# Patient Record
Sex: Male | Born: 1995 | Race: Black or African American | Hispanic: No | Marital: Single | Smoking: Never smoker
Health system: Southern US, Community
[De-identification: ages and names within clinical notes are randomized; demographics above are authoritative.]

## PROBLEM LIST (undated history)

## (undated) DIAGNOSIS — K649 Unspecified hemorrhoids: Secondary | ICD-10-CM

## (undated) DIAGNOSIS — E669 Obesity, unspecified: Secondary | ICD-10-CM

## (undated) HISTORY — DX: Obesity, unspecified: E66.9

---
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
## 2013-05-14 ENCOUNTER — Emergency Department (HOSPITAL_COMMUNITY): Payer: Self-pay

## 2013-05-14 ENCOUNTER — Encounter (HOSPITAL_COMMUNITY): Payer: Self-pay | Admitting: *Deleted

## 2013-05-14 ENCOUNTER — Emergency Department (HOSPITAL_COMMUNITY)
Admission: EM | Admit: 2013-05-14 | Discharge: 2013-05-14 | Disposition: A | Discharge status: Home / Self Care | Payer: Self-pay | Attending: Emergency Medicine | Admitting: Emergency Medicine

## 2013-05-14 DIAGNOSIS — S63509A Unspecified sprain of unspecified wrist, initial encounter: Secondary | ICD-10-CM | POA: Insufficient documentation

## 2013-05-14 DIAGNOSIS — Y9289 Other specified places as the place of occurrence of the external cause: Secondary | ICD-10-CM | POA: Insufficient documentation

## 2013-05-14 DIAGNOSIS — S63502A Unspecified sprain of left wrist, initial encounter: Secondary | ICD-10-CM

## 2013-05-14 DIAGNOSIS — R296 Repeated falls: Secondary | ICD-10-CM | POA: Insufficient documentation

## 2013-05-14 DIAGNOSIS — Y9389 Activity, other specified: Secondary | ICD-10-CM | POA: Insufficient documentation

## 2013-05-14 MED ORDER — IBUPROFEN 600 MG PO TABS: 600.0000 mg | ORAL_TABLET | Freq: Four times a day (QID) | ORAL | Status: DC | PRN

## 2013-05-14 NOTE — ED Notes (Signed)
Pt fell on left wrist while performing in play last night, cms intact distal

## 2013-05-14 NOTE — ED Provider Notes (Signed)
History     CSN: [id number]  Arrival date & time 05/14/13  1407   First MD Initiated Contact with Patient 05/14/13 1427      Chief Complaint  Patient presents with  . Wrist Pain    (Consider location/radiation/quality/duration/timing/severity/associated sxs/prior treatment) HPI Wayne Reed is a 17 y.o. male who presents to the ED with left wrist pain. He fell while performing in a play last night. He was supposed to fall but not as hard as he did. He complains of pain in the left wrist on the lateral aspect. Denies any other injuries.  History reviewed. No pertinent past medical history.  History reviewed. No pertinent past surgical history.  No family history on file.  History  Substance Use Topics  . Smoking status: Not on file  . Smokeless tobacco: Not on file  . Alcohol Use: Not on file      Review of Systems  Constitutional: Negative for fever.  HENT: Negative for neck pain.   Gastrointestinal: Negative for nausea and vomiting.  Musculoskeletal:       Left wrist pain  Skin: Negative for wound.  Psychiatric/Behavioral: The patient is not nervous/anxious.     Allergies  Review of patient's allergies indicates no known allergies.  Home Medications  No current outpatient prescriptions on file.  BP 130/73  Pulse 80  Temp(Src) 99.2 F (37.3 C) (Oral)  Resp 20  SpO2 100%  Physical Exam  Nursing note and vitals reviewed. Constitutional: [name] oriented to person, place, and time. He appears well-developed and well-nourished. No distress.  HENT:  Head: Normocephalic.  Eyes: EOM are normal.  Neck: Normal range of motion. Neck supple.  Cardiovascular: Normal rate.   Pulmonary/Chest: Effort normal.  Musculoskeletal:       Left wrist: He exhibits tenderness. He exhibits normal range of motion, no swelling, no deformity and no laceration.  Tender with range of motion and palpation ulnar aspect of left wrist. Radial pulse present, adequate circulation,  good touch sensation.  Neurological: [name] alert and oriented to person, place, and time. He has normal strength. No cranial nerve deficit or sensory deficit.  Skin: Skin is warm and dry.  Psychiatric: He has a normal mood and affect. His behavior is normal. Judgment and thought content normal.    ED Course  Procedures (including critical care time)  Dg Wrist Complete Left  05/14/2013   *RADIOLOGY REPORT*  Clinical Data: Fall  LEFT WRIST - COMPLETE 3+ VIEW  Comparison: None.  Findings: No acute fracture and no dislocation.  Unremarkable soft tissues.  Prominent negative ulnar variance deformity.  IMPRESSION: No acute bony pathology.  Chronic change.   Original Report Authenticated By: Jolaine Click, M.D.    MDM  17 y.o. male with sprain to left wrist. Will apply wrist splint, apply ice, elevate and follow up with his PCP. Patient stable for discharge without signs of compartment syndrome.    Medication List    TAKE these medications       ibuprofen 600 MG tablet  Commonly known as:  ADVIL,MOTRIN  Take 1 tablet (600 mg total) by mouth every 6 (six) hours as needed for pain.     ibuprofen 600 MG tablet  Commonly known as:  ADVIL,MOTRIN  Take 1 tablet (600 mg total) by mouth every 6 (six) hours as needed for pain.               [facility] Orlene Och, [place] 05/14/13 1745

## 2013-05-15 NOTE — ED Provider Notes (Signed)
Medical screening examination/treatment/procedure(s) were performed by non-physician practitioner and as supervising physician I was immediately available for consultation/collaboration.   Laray Anger, DO 05/15/13 2133

## 2013-09-23 ENCOUNTER — Encounter (HOSPITAL_COMMUNITY): Payer: Self-pay | Admitting: *Deleted

## 2013-09-23 ENCOUNTER — Emergency Department (HOSPITAL_COMMUNITY): Payer: Self-pay

## 2013-09-23 ENCOUNTER — Emergency Department (HOSPITAL_COMMUNITY)
Admission: EM | Admit: 2013-09-23 | Discharge: 2013-09-23 | Disposition: A | Discharge status: Home / Self Care | Payer: Self-pay | Attending: Emergency Medicine | Admitting: Emergency Medicine

## 2013-09-23 DIAGNOSIS — R1110 Vomiting, unspecified: Secondary | ICD-10-CM | POA: Insufficient documentation

## 2013-09-23 DIAGNOSIS — I880 Nonspecific mesenteric lymphadenitis: Secondary | ICD-10-CM | POA: Insufficient documentation

## 2013-09-23 LAB — URINALYSIS, ROUTINE W REFLEX MICROSCOPIC
Hgb urine dipstick: NEGATIVE
Ketones, ur: NEGATIVE mg/dL
Specific Gravity, Urine: >1.03 — ABNORMAL HIGH (ref 1.005–1.030)

## 2013-09-23 LAB — BASIC METABOLIC PANEL
BUN: 9 mg/dL (ref 6–23)
CO2: 26 mEq/L (ref 19–32)
Calcium: 10.3 mg/dL (ref 8.4–10.5)
Chloride: 103 mEq/L (ref 96–112)
Creatinine, Ser: 0.73 mg/dL (ref 0.50–1.35)
GFR calc Af Amer: >90 mL/min (ref >90)
GFR calc non Af Amer: >90 mL/min (ref >90)
Glucose, Bld: 90 mg/dL (ref 70–99)
Potassium: 4.5 mEq/L (ref 3.5–5.1)
Sodium: 140 mEq/L (ref 135–145)

## 2013-09-23 LAB — CBC WITH DIFFERENTIAL/PLATELET
Basophils Absolute: 0 10*3/uL (ref 0.0–0.1)
Basophils Relative: 0 % (ref 0–1)
Eosinophils Absolute: 0.4 10*3/uL (ref 0.0–0.7)
Eosinophils Relative: 4 % (ref 0–5)
HCT: 42.3 % (ref 39.0–52.0)
Hemoglobin: 14.3 g/dL (ref 13.0–17.0)
Lymphocytes Relative: 29 % (ref 12–46)
Lymphs Abs: 2.4 10*3/uL (ref 0.7–4.0)
MCH: 27.7 pg (ref 26.0–34.0)
MCHC: 33.8 g/dL (ref 30.0–36.0)
MCV: 82 fL (ref 78.0–100.0)
Monocytes Absolute: 0.8 10*3/uL (ref 0.1–1.0)
Monocytes Relative: 9 % (ref 3–12)
Neutro Abs: 4.8 10*3/uL (ref 1.7–7.7)
Neutrophils Relative %: 57 % (ref 43–77)
Platelets: 333 10*3/uL (ref 150–400)
RBC: 5.16 MIL/uL (ref 4.22–5.81)
RDW: 14.5 % (ref 11.5–15.5)
WBC: 8.4 10*3/uL (ref 4.0–10.5)

## 2013-09-23 MED ORDER — SODIUM CHLORIDE 0.9 % IV BOLUS (SEPSIS)
1000.0000 mL | Freq: Once | INTRAVENOUS | Status: AC
  Administered 2013-09-23: 1000 mL via INTRAVENOUS

## 2013-09-23 MED ORDER — IOHEXOL 300 MG/ML  SOLN: 50.0000 mL | Freq: Once | INTRAMUSCULAR | Status: DC | PRN

## 2013-09-23 MED ORDER — KETOROLAC TROMETHAMINE 30 MG/ML IJ SOLN: 30.0000 mg | Freq: Once | INTRAMUSCULAR | Status: DC

## 2013-09-23 MED ORDER — IOHEXOL 300 MG/ML  SOLN
50.0000 mL | Freq: Once | INTRAMUSCULAR | Status: AC | PRN
  Administered 2013-09-23: 50 mL via ORAL

## 2013-09-23 MED ORDER — OXYCODONE-ACETAMINOPHEN 5-325 MG PO TABS
1.0000 | ORAL_TABLET | Freq: Once | ORAL | Status: AC
  Administered 2013-09-23: 1 via ORAL
  Filled 2013-09-23: qty 1

## 2013-09-23 MED ORDER — TRAMADOL HCL 50 MG PO TABS: 50.0000 mg | ORAL_TABLET | Freq: Four times a day (QID) | ORAL | Status: DC | PRN

## 2013-09-23 MED ORDER — IOHEXOL 300 MG/ML  SOLN
100.0000 mL | Freq: Once | INTRAMUSCULAR | Status: AC | PRN
  Administered 2013-09-23: 100 mL via INTRAVENOUS

## 2013-09-23 MED ORDER — ONDANSETRON 8 MG PO TBDP
8.0000 mg | ORAL_TABLET | Freq: Once | ORAL | Status: AC
  Administered 2013-09-23: 8 mg via ORAL
  Filled 2013-09-23: qty 1

## 2013-09-23 NOTE — ED Provider Notes (Signed)
CSN: [id number]     Arrival date & time 09/23/13  1227 History   First MD Initiated Contact with Patient 09/23/13 1303     Chief Complaint  Patient presents with  . Abdominal Pain   (Consider location/radiation/quality/duration/timing/severity/associated sxs/prior Treatment) HPI  17 year old male with right-sided flank, R midback and RLQ pain. Onset about 2 days ago. Worsening throughout the day today. Vomited  this morning. Pain is achy in character and sharper pain with movement. No urinary complaints. No fever or chills. No history similar complaints. Denies trauma. No intervention prior to arrival.  History reviewed. No pertinent past medical history. History reviewed. No pertinent past surgical history. History reviewed. No pertinent family history. History  Substance Use Topics  . Smoking status: Never Smoker   . Smokeless tobacco: Not on file  . Alcohol Use: No    Review of Systems  All systems reviewed and negative, other than as noted in HPI.   Allergies  Review of patient's allergies indicates no known allergies.  Home Medications  No current outpatient prescriptions on file. BP 137/76  Pulse 79  Temp(Src) 98.4 F (36.9 C) (Oral)  Resp 20  Ht 6\' 4"  (1.93 m)  Wt 294 lb (133.358 kg)  BMI 35.8 kg/m2  SpO2 100% Physical Exam  Nursing note and vitals reviewed. Constitutional: He appears well-developed and well-nourished. No distress.  HENT:  Head: Normocephalic and atraumatic.  Eyes: Conjunctivae are normal. Right eye exhibits no discharge. Left eye exhibits no discharge.  Neck: Neck supple.  Cardiovascular: Normal rate, regular rhythm and normal heart sounds.  Exam reveals no gallop and no friction rub.   No murmur heard. Pulmonary/Chest: Effort normal and breath sounds normal. No respiratory distress.  Abdominal: Soft. He exhibits no distension and no mass. There is no tenderness. There is no guarding.  Genitourinary:  R CVA tenderness  Musculoskeletal: He  exhibits no edema and no tenderness.  Neurological: [name] alert.  Skin: Skin is warm and dry.  Psychiatric: He has a normal mood and affect. His behavior is normal. Thought content normal.    ED Course  Procedures (including critical care time) Labs Review Labs Reviewed  URINALYSIS, ROUTINE W REFLEX MICROSCOPIC - Abnormal; Notable for the following:    Specific Gravity, Urine >1.030 (*)    All other components within normal limits  CBC WITH DIFFERENTIAL  BASIC METABOLIC PANEL   Imaging Review Ct Abdomen Pelvis W Contrast  09/23/2013   CLINICAL DATA:  Right-sided abdominal pain and vomiting this morning.  EXAM: CT ABDOMEN AND PELVIS WITH CONTRAST  TECHNIQUE: Multidetector CT imaging of the abdomen and pelvis was performed using the standard protocol following bolus administration of intravenous contrast.  CONTRAST:  50mL OMNIPAQUE IOHEXOL 300 MG/ML SOLN, OMNIPAQUE IOHEXOL 300 MG/ML SOLN  COMPARISON:  None  FINDINGS: The lung bases are unremarkable.  No focal abnormality identified within the liver, spleen, pancreas, adrenal glands, or kidneys. The ureteral courses are unremarkable. The gallbladder is present.  The stomach and small bowel loops are normal in appearance. The appendix is well seen and has a normal appearance. Colonic loops are normal in appearance. Within the right lower quadrant mesentery, there are multiple enlarged lymph nodes, measuring up to 1.6 cm. No abscess, free intraperitoneal air.  No free pelvic fluid. No evidence for aortic aneurysm. Visualized osseous structures have a normal appearance.  IMPRESSION: 1. Normal appearing appendix. 2. Right lower quadrant mesenteric lymph nodes, consistent with mesenteric adenitis. 3. No abscess or bowel obstruction.  Electronically Signed   By: Rosalie Gums M.D.   On: 09/23/2013 15:25    MDM   1. Mesenteric adenitis     17 year old male with right lower quadrant pain. CT consistent with mesenteric adenitis. Plan symptomatic  treatment for likely self-limited process. Return precautions discussed.      Raeford Razor, MD 10/03/13 [phone]

## 2013-09-23 NOTE — ED Notes (Signed)
Pain rt lateral chest 2 days ago and now pain rt flank and RLQ.  Vomited x1 this am.  Increased pain with movement. No injury.  No fever.

## 2013-12-14 ENCOUNTER — Encounter (HOSPITAL_COMMUNITY): Payer: Self-pay | Admitting: Emergency Medicine

## 2013-12-14 ENCOUNTER — Emergency Department (HOSPITAL_COMMUNITY)
Admission: EM | Admit: 2013-12-14 | Discharge: 2013-12-15 | Disposition: A | Discharge status: Home / Self Care | Payer: Self-pay | Attending: Emergency Medicine | Admitting: Emergency Medicine

## 2013-12-14 DIAGNOSIS — R51 Headache: Secondary | ICD-10-CM | POA: Insufficient documentation

## 2013-12-14 DIAGNOSIS — H53149 Visual discomfort, unspecified: Secondary | ICD-10-CM | POA: Insufficient documentation

## 2013-12-14 DIAGNOSIS — J069 Acute upper respiratory infection, unspecified: Secondary | ICD-10-CM | POA: Insufficient documentation

## 2013-12-14 MED ORDER — KETOROLAC TROMETHAMINE 60 MG/2ML IM SOLN
60.0000 mg | Freq: Once | INTRAMUSCULAR | Status: AC
  Administered 2013-12-14: 60 mg via INTRAMUSCULAR
  Filled 2013-12-14: qty 2

## 2013-12-14 MED ORDER — NAPROXEN 500 MG PO TABS: 500.0000 mg | ORAL_TABLET | Freq: Two times a day (BID) | ORAL | Status: DC

## 2013-12-14 NOTE — ED Notes (Signed)
Patient complaining of headache x 4 days.

## 2013-12-14 NOTE — ED Notes (Signed)
Pt c/o headache. Denies visual disturbances.  Denies nausea at this time.

## 2013-12-14 NOTE — ED Provider Notes (Signed)
CSN: [id number]     Arrival date & time 12/14/13  2306 History   First MD Initiated Contact with Patient 12/14/13 2333     Chief Complaint  Patient presents with  . Headache   (Consider location/radiation/quality/duration/timing/severity/associated sxs/prior Treatment) Patient is a 17 y.o. male presenting with headaches.  Headache   17 year old male, no significant past medical history presents with approximately 4 days of intermittent headaches. This started shortly after ingesting food from a local fast food restaurant, followed by 2 days of nausea vomiting and diarrhea which has since resolved. [name] now able to tolerate oral fluids and foods and has been taking ibuprofen. His headache is intermittent, it will completely resolve and then will come back on in a gradual fashion, sometimes located in the bitemporal areas, sometimes in the retro-orbital areas and sometimes posterior and involving the neck. He has associated photophobia intermittently but no more nausea, no weakness numbness difficulty walking dizziness lightheadedness or vertigo. He does not have a history of headaches, he has not had any head injuries, he does not have any fevers nor does he have any exposure to carbon monoxide.  He states that over the last 24 hours he has developed some nasal discharge and congestion but no sore throat or cough  History reviewed. No pertinent past medical history. History reviewed. No pertinent past surgical history. History reviewed. No pertinent family history. History  Substance Use Topics  . Smoking status: Never Smoker   . Smokeless tobacco: Not on file  . Alcohol Use: No    Review of Systems  Neurological: Positive for headaches.  All other systems reviewed and are negative.    Allergies  Review of patient's allergies indicates no known allergies.  Home Medications   Current Outpatient Rx  Name  Route  Sig  Dispense  Refill  . naproxen (NAPROSYN) 500 MG tablet   Oral  Take 1 tablet (500 mg total) by mouth 2 (two) times daily with a meal.   30 tablet   0   . traMADol (ULTRAM) 50 MG tablet   Oral   Take 1 tablet (50 mg total) by mouth every 6 (six) hours as needed for pain.   15 tablet   0    BP 136/92  Pulse 83  Temp(Src) 98.3 F (36.8 C) (Oral)  Resp 18  Ht 6\' 3"  (1.905 m)  Wt 292 lb (132.45 kg)  BMI 36.50 kg/m2  SpO2 100% Physical Exam  Nursing note and vitals reviewed. Constitutional: He appears well-developed and well-nourished. No distress.  HENT:  Head: Normocephalic and atraumatic.  Mouth/Throat: Oropharynx is clear and moist. No oropharyngeal exudate.  Tympanic membranes are clear bilaterally, nostrils clear with no swollen turbinates, no discharge, clear oropharynx  Eyes: Conjunctivae and EOM are normal. Pupils are equal, round, and reactive to light. Right eye exhibits no discharge. Left eye exhibits no discharge. No scleral icterus.  Neck: Normal range of motion. Neck supple. No JVD present. No thyromegaly present.  Very supple neck with no lymphadenopathy  Cardiovascular: Normal rate, regular rhythm, normal heart sounds and intact distal pulses.  Exam reveals no gallop and no friction rub.   No murmur heard. Pulmonary/Chest: Effort normal and breath sounds normal. No respiratory distress. He has no wheezes. He has no rales.  Abdominal: Soft. Bowel sounds are normal. He exhibits no distension and no mass. There is no tenderness.  Musculoskeletal: Normal range of motion. He exhibits no edema and no tenderness.  Lymphadenopathy:    He has  no cervical adenopathy.  Neurological: [name] alert. Coordination normal.  Clear speech, normal gait, normal strength, normal memory, normal coordination  Skin: Skin is warm and dry. No rash noted. No erythema.  Psychiatric: He has a normal mood and affect. His behavior is normal.    ED Course  Procedures (including critical care time) Labs Review Labs Reviewed - No data to display Imaging  Review No results found.  EKG Interpretation   None       MDM   1. Headache   2. URI, acute    The patient is well-appearing, normal vital signs, likely has an upper respiratory infection which is very mild, after developing a foodborne illness he has developed a mild intermittent headache. He does not have a headache at this time, he appears benign, no red flags for pathologic sources of his headache, no indication for imaging, stable for discharge, discussed with the patient indications for return and he has voiced his understanding.   Meds given in ED:  Medications  ketorolac (TORADOL) injection 60 mg (not administered)    New Prescriptions   NAPROXEN (NAPROSYN) 500 MG TABLET    Take 1 tablet (500 mg total) by mouth 2 (two) times daily with a meal.        Vida Roller, MD 12/14/13 2344

## 2014-03-17 ENCOUNTER — Encounter (HOSPITAL_COMMUNITY): Payer: Self-pay | Admitting: Emergency Medicine

## 2014-03-17 ENCOUNTER — Emergency Department (HOSPITAL_COMMUNITY)
Admission: EM | Admit: 2014-03-17 | Discharge: 2014-03-17 | Disposition: A | Discharge status: Home / Self Care | Payer: Self-pay | Attending: Emergency Medicine | Admitting: Emergency Medicine

## 2014-03-17 DIAGNOSIS — K5900 Constipation, unspecified: Secondary | ICD-10-CM | POA: Insufficient documentation

## 2014-03-17 DIAGNOSIS — Z79899 Other long term (current) drug therapy: Secondary | ICD-10-CM | POA: Insufficient documentation

## 2014-03-17 DIAGNOSIS — K644 Residual hemorrhoidal skin tags: Secondary | ICD-10-CM | POA: Insufficient documentation

## 2014-03-17 MED ORDER — HYDROCORTISONE 2.5 % RE CREA: TOPICAL_CREAM | RECTAL | Status: DC

## 2014-03-17 MED ORDER — DOCUSATE SODIUM 250 MG PO CAPS: 250.0000 mg | ORAL_CAPSULE | Freq: Every day | ORAL | Status: DC

## 2014-03-17 NOTE — ED Notes (Signed)
Pt received discharge instructions and prescriptions, verbalized understanding and has no further questions. Pt ambulated to exit in stable condition.  Advised to return to emergency department with new or worsening symptoms.  

## 2014-03-17 NOTE — ED Notes (Signed)
Pt c/o rectal pain and bleeding x1-2 days. Pt states "My dad told me he thinks it's hemorrhoids".

## 2014-03-17 NOTE — Discharge Instructions (Signed)
Fiber Content in Foods Drinking plenty of fluids and consuming foods high in fiber can help with constipation. See the list below for the fiber content of some common foods. Starches and Grains / Dietary Fiber (g)  Cheerios, 1 cup / 3 g  Kellogg's Corn Flakes, 1 cup / 0.7 g  Rice Krispies, 1  cup / 0.3 g  Quaker Oat Life Cereal,  cup / 2.1 g  Oatmeal, instant (cooked),  cup / 2 g  Kellogg's Frosted Mini Wheats, 1 cup / 5.1 g  Rice, brown, long-grain (cooked), 1 cup / 3.5 g  Rice, white, long-grain (cooked), 1 cup / 0.6 g  Macaroni, cooked, enriched, 1 cup / 2.5 g Legumes / Dietary Fiber (g)  Beans, baked, canned, plain or vegetarian,  cup / 5.2 g  Beans, kidney, canned,  cup / 6.8 g  Beans, pinto, dried (cooked),  cup / 7.7 g  Beans, pinto, canned,  cup / 5.5 g Breads and Crackers / Dietary Fiber (g)  Graham crackers, plain or honey, 2 squares / 0.7 g  Saltine crackers, 3 squares / 0.3 g  Pretzels, plain, salted, 10 pieces / 1.8 g  Bread, whole-wheat, 1 slice / 1.9 g  Bread, white, 1 slice / 0.7 g  Bread, raisin, 1 slice / 1.2 g  Bagel, plain, 3 oz / 2 g  Tortilla, flour, 1 oz / 0.9 g  Tortilla, corn, 1 small / 1.5 g  Bun, hamburger or hotdog, 1 small / 0.9 g Fruits / Dietary Fiber (g)  Apple, raw with skin, 1 medium / 4.4 g  Applesauce, sweetened,  cup / 1.5 g  Banana,  medium / 1.5 g  Grapes, 10 grapes / 0.4 g  Orange, 1 small / 2.3 g  Raisin, 1.5 oz / 1.6 g  Melon, 1 cup / 1.4 g Vegetables / Dietary Fiber (g)  Green beans, canned,  cup / 1.3 g  Carrots (cooked),  cup / 2.3 g  Broccoli (cooked),  cup / 2.8 g  Peas, frozen (cooked),  cup / 4.4 g  Potatoes, mashed,  cup / 1.6 g  Lettuce, 1 cup / 0.5 g  Corn, canned,  cup / 1.6 g  Tomato,  cup / 1.1 g Document Released: 04/26/2007 Document Revised: 03/01/2012 Document Reviewed: 06/21/2007 Exit[employer] Patient Information 2014 [employer] [place].  Hemorrhoids Hemorrhoids  are swollen veins around the rectum or anus. There are two types of hemorrhoids:   Internal hemorrhoids. These occur in the veins just inside the rectum. They may poke through to the outside and become irritated and painful.  External hemorrhoids. These occur in the veins outside the anus and can be felt as a painful swelling or hard lump near the anus. CAUSES  Pregnancy.   Obesity.   Constipation or diarrhea.   Straining to have a bowel movement.   Sitting for long periods on the toilet.  Heavy lifting or other activity that caused you to strain.  Anal intercourse. SYMPTOMS   Pain.   Anal itching or irritation.   Rectal bleeding.   Fecal leakage.   Anal swelling.   One or more lumps around the anus.  DIAGNOSIS  Your caregiver may be able to diagnose hemorrhoids by visual examination. Other examinations or tests that may be performed include:   Examination of the rectal area with a gloved hand (digital rectal exam).   Examination of anal canal using a small tube (scope).   A blood test if you have lost a significant  amount of blood. °· A test to look inside the colon (sigmoidoscopy or colonoscopy). °TREATMENT °Most hemorrhoids can be treated at home. However, if symptoms do not seem to be getting better or if you have a lot of rectal bleeding, your caregiver may perform a procedure to help make the hemorrhoids get smaller or remove them completely. Possible treatments include:  °· Placing a rubber band at the base of the hemorrhoid to cut off the circulation (rubber band ligation).   °· Injecting a chemical to shrink the hemorrhoid (sclerotherapy).   °· Using a tool to burn the hemorrhoid (infrared light therapy).   °· Surgically removing the hemorrhoid (hemorrhoidectomy).   °· Stapling the hemorrhoid to block blood flow to the tissue (hemorrhoid stapling).   °HOME CARE INSTRUCTIONS  °· Eat foods with fiber, such as whole grains, beans, nuts, fruits, and  vegetables. Ask your doctor about taking products with added fiber in them (fiber supplements). °· Increase fluid intake. Drink enough water and fluids to keep your urine clear or pale yellow.   °· Exercise regularly.   °· Go to the bathroom when you have the urge to have a bowel movement. Do not wait.   °· Avoid straining to have bowel movements.   °· Keep the anal area dry and clean. Use wet toilet paper or moist towelettes after a bowel movement.   °· Medicated creams and suppositories may be used or applied as directed.   °· Only take over-the-counter or prescription medicines as directed by your caregiver.   °· Take warm sitz baths for 15 20 minutes, 3 4 times a day to ease pain and discomfort.   °· Place ice packs on the hemorrhoids if they are tender and swollen. Using ice packs between sitz baths may be helpful.   °· Put ice in a plastic bag.   °· Place a towel between your skin and the bag.   °· Leave the ice on for 15 20 minutes, 3 4 times a day.   °· Do not use a donut-shaped pillow or sit on the toilet for long periods. This increases blood pooling and pain.   °SEEK MEDICAL CARE IF: °· You have increasing pain and swelling that is not controlled by treatment or medicine. °· You have uncontrolled bleeding. °· You have difficulty or you are unable to have a bowel movement. °· You have pain or inflammation outside the area of the hemorrhoids. °MAKE SURE YOU: °· Understand these instructions. °· Will watch your condition. °· Will get help right away if you are not doing well or get worse. °Document Released: 12/05/2000 Document Revised: 11/24/2012 Document Reviewed: 10/12/2012 °ExitCare® Patient Information ©2014 [employer]. ° °

## 2014-03-17 NOTE — ED Provider Notes (Signed)
CSN: [id number]     Arrival date & time 03/17/14  0957 History   First MD Initiated Contact with Patient 03/17/14 1020     Chief Complaint  Patient presents with  . Hemorrhoids     (Consider location/radiation/quality/duration/timing/severity/associated sxs/prior Treatment) HPI Comments: Wayne Reed is a 18 y.o. male who presents to the Emergency Department complaining of rectal pain and hemorrhoids.  Patient states that he has been straining to have  bowel movements recently and noticed a hemorrhoid 2-3 days ago.  He also reports intermittent bleeding from his rectum after defecation and states he sees bright red blood on the tissue after wiping.    He denies fever, chills, vomiting, or abdominal pain.  He has tried preparation H cream without relief.    The history is provided by the patient.    History reviewed. No pertinent past medical history. History reviewed. No pertinent past surgical history. History reviewed. No pertinent family history. History  Substance Use Topics  . Smoking status: Never Smoker   . Smokeless tobacco: Not on file  . Alcohol Use: No    Review of Systems  Gastrointestinal: Positive for constipation, blood in stool and rectal pain. Negative for nausea, vomiting, abdominal pain and diarrhea.  Genitourinary: Negative for dysuria and flank pain.  Musculoskeletal: Negative for back pain.  Skin: Negative for color change and wound.  Neurological: Negative for dizziness and syncope.  Hematological: Negative for adenopathy.  All other systems reviewed and are negative.      Allergies  Review of patient's allergies indicates no known allergies.  Home Medications   Current Outpatient Rx  Name  Route  Sig  Dispense  Refill  . naproxen (NAPROSYN) 500 MG tablet   Oral   Take 1 tablet (500 mg total) by mouth 2 (two) times daily with a meal.   30 tablet   0   . traMADol (ULTRAM) 50 MG tablet   Oral   Take 1 tablet (50 mg total) by mouth every  6 (six) hours as needed for pain.   15 tablet   0    BP 132/74  Pulse 86  Temp(Src) 98.4 F (36.9 C) (Oral)  Resp 20  SpO2 100% Physical Exam  Nursing note and vitals reviewed. Constitutional: [name] oriented to person, place, and time. He appears well-developed and well-nourished. No distress.  HENT:  Head: Normocephalic and atraumatic.  Mouth/Throat: Oropharynx is clear and moist.  Cardiovascular: Normal rate, regular rhythm, normal heart sounds and intact distal pulses.   No murmur heard. Pulmonary/Chest: Effort normal and breath sounds normal. No respiratory distress. He exhibits no tenderness.  Abdominal: Soft. Bowel sounds are normal. He exhibits no distension and no mass. There is no tenderness. There is no rebound and no guarding.  Genitourinary: Rectal exam shows external hemorrhoid and tenderness. Rectal exam shows no internal hemorrhoid, no fissure, no mass and anal tone normal.  Guaiac neg, brown stool.  Small external hemorrhoid, no thrombus.  No palpable rectal masses  Musculoskeletal: Normal range of motion.  Neurological: [name] alert and oriented to person, place, and time. Coordination normal.  Skin: Skin is warm and dry.    ED Course  Procedures (including critical care time) Labs Review Labs Reviewed - No data to display Imaging Review No results found.   EKG Interpretation None      MDM   Final diagnoses:  Hemorrhoids, external    External hemorrhoid present, no thrombus.  No active bleeding.  Pt is well  appearing.  VSS.  abd is soft, NT on exam.  Pt advised on proper diet, increase fiber intake and water.  He agrees to symptomatic treatment with colace and anusol HC.  Pt also agrees to f/u with his PMD if needed.    The patient appears reasonably screened and/or stabilized for discharge and I doubt any other medical condition or other [facility] requiring further screening, evaluation, or treatment in the ED at this time prior to discharge.    Sadiq Mccauley L.  Trisha Mangleriplett, PA-C 03/18/14 2105

## 2014-03-19 NOTE — ED Provider Notes (Signed)
Medical screening examination/treatment/procedure(s) were performed by non-physician practitioner and as supervising physician I was immediately available for consultation/collaboration.   EKG Interpretation None        Andry Bogden M Tomeika Weinmann, DO 03/19/14 1456 

## 2014-04-11 ENCOUNTER — Encounter (HOSPITAL_COMMUNITY): Payer: Self-pay | Admitting: Emergency Medicine

## 2014-04-11 ENCOUNTER — Emergency Department (HOSPITAL_COMMUNITY)
Admission: EM | Admit: 2014-04-11 | Discharge: 2014-04-11 | Disposition: A | Discharge status: Home / Self Care | Payer: Self-pay | Attending: Emergency Medicine | Admitting: Emergency Medicine

## 2014-04-11 ENCOUNTER — Emergency Department (HOSPITAL_COMMUNITY): Payer: Self-pay

## 2014-04-11 DIAGNOSIS — M542 Cervicalgia: Secondary | ICD-10-CM | POA: Insufficient documentation

## 2014-04-11 DIAGNOSIS — J039 Acute tonsillitis, unspecified: Secondary | ICD-10-CM | POA: Insufficient documentation

## 2014-04-11 DIAGNOSIS — IMO0002 Reserved for concepts with insufficient information to code with codable children: Secondary | ICD-10-CM | POA: Insufficient documentation

## 2014-04-11 DIAGNOSIS — Z79899 Other long term (current) drug therapy: Secondary | ICD-10-CM | POA: Insufficient documentation

## 2014-04-11 DIAGNOSIS — R110 Nausea: Secondary | ICD-10-CM | POA: Insufficient documentation

## 2014-04-11 LAB — CBC WITH DIFFERENTIAL/PLATELET
BASOS PCT: 0 % (ref 0–1)
Basophils Absolute: 0 10*3/uL (ref 0.0–0.1)
Eosinophils Absolute: 0.2 10*3/uL (ref 0.0–0.7)
Eosinophils Relative: 3 % (ref 0–5)
HEMATOCRIT: 43.8 % (ref 39.0–52.0)
HEMOGLOBIN: 14.4 g/dL (ref 13.0–17.0)
LYMPHS ABS: 2 10*3/uL (ref 0.7–4.0)
Lymphocytes Relative: 24 % (ref 12–46)
MCH: 27.3 pg (ref 26.0–34.0)
MCHC: 32.9 g/dL (ref 30.0–36.0)
MCV: 83.1 fL (ref 78.0–100.0)
MONO ABS: 0.7 10*3/uL (ref 0.1–1.0)
MONOS PCT: 9 % (ref 3–12)
NEUTROS ABS: 5.3 10*3/uL (ref 1.7–7.7)
Neutrophils Relative %: 64 % (ref 43–77)
Platelets: 335 10*3/uL (ref 150–400)
RBC: 5.27 MIL/uL (ref 4.22–5.81)
RDW: 14.4 % (ref 11.5–15.5)
WBC: 8.2 10*3/uL (ref 4.0–10.5)

## 2014-04-11 LAB — COMPREHENSIVE METABOLIC PANEL
ALBUMIN: 4.4 g/dL (ref 3.5–5.2)
ALT: 21 U/L (ref 0–53)
AST: 21 U/L (ref 0–37)
Alkaline Phosphatase: 85 U/L (ref 39–117)
BUN: 9 mg/dL (ref 6–23)
CO2: 26 mEq/L (ref 19–32)
CREATININE: 0.79 mg/dL (ref 0.50–1.35)
Calcium: 9.9 mg/dL (ref 8.4–10.5)
Chloride: 104 mEq/L (ref 96–112)
GFR calc Af Amer: >90 mL/min (ref >90)
Glucose, Bld: 87 mg/dL (ref 70–99)
Potassium: 4.1 mEq/L (ref 3.7–5.3)
Sodium: 142 mEq/L (ref 137–147)
Total Bilirubin: 0.4 mg/dL (ref 0.3–1.2)
Total Protein: 7.9 g/dL (ref 6.0–8.3)

## 2014-04-11 LAB — MONONUCLEOSIS SCREEN: Mono Screen: NEGATIVE

## 2014-04-11 LAB — RAPID STREP SCREEN (MED CTR MEBANE ONLY): Streptococcus, Group A Screen (Direct): NEGATIVE

## 2014-04-11 MED ORDER — CLINDAMYCIN HCL 300 MG PO CAPS: 300.0000 mg | ORAL_CAPSULE | Freq: Three times a day (TID) | ORAL | Status: DC

## 2014-04-11 MED ORDER — PREDNISONE 50 MG PO TABS: ORAL_TABLET | ORAL | Status: DC

## 2014-04-11 MED ORDER — CLINDAMYCIN PHOSPHATE 600 MG/50ML IV SOLN
600.0000 mg | Freq: Once | INTRAVENOUS | Status: AC
  Administered 2014-04-11: 600 mg via INTRAVENOUS
  Filled 2014-04-11: qty 50

## 2014-04-11 MED ORDER — ONDANSETRON HCL 4 MG/2ML IJ SOLN
4.0000 mg | Freq: Once | INTRAMUSCULAR | Status: AC
  Administered 2014-04-11: 4 mg via INTRAVENOUS
  Filled 2014-04-11: qty 2

## 2014-04-11 MED ORDER — PREDNISONE 50 MG PO TABS
60.0000 mg | ORAL_TABLET | Freq: Once | ORAL | Status: DC
  Filled 2014-04-11 (×2): qty 1

## 2014-04-11 MED ORDER — KETOROLAC TROMETHAMINE 30 MG/ML IJ SOLN
30.0000 mg | Freq: Once | INTRAMUSCULAR | Status: AC
  Administered 2014-04-11: 30 mg via INTRAVENOUS
  Filled 2014-04-11: qty 1

## 2014-04-11 MED ORDER — IOHEXOL 300 MG/ML  SOLN
75.0000 mL | Freq: Once | INTRAMUSCULAR | Status: AC | PRN
  Administered 2014-04-11: 75 mL via INTRAVENOUS

## 2014-04-11 MED ORDER — DEXAMETHASONE SODIUM PHOSPHATE 4 MG/ML IJ SOLN
10.0000 mg | Freq: Once | INTRAMUSCULAR | Status: AC
  Administered 2014-04-11: 10 mg via INTRAVENOUS
  Filled 2014-04-11: qty 3

## 2014-04-11 MED ORDER — SODIUM CHLORIDE 0.9 % IV BOLUS (SEPSIS)
1000.0000 mL | Freq: Once | INTRAVENOUS | Status: AC
  Administered 2014-04-11: 1000 mL via INTRAVENOUS

## 2014-04-11 MED ORDER — CLINDAMYCIN HCL 150 MG PO CAPS
300.0000 mg | ORAL_CAPSULE | Freq: Once | ORAL | Status: DC
  Filled 2014-04-11: qty 2

## 2014-04-11 MED ORDER — HYDROCODONE-ACETAMINOPHEN 5-325 MG PO TABS: 2.0000 | ORAL_TABLET | ORAL | Status: DC | PRN

## 2014-04-11 NOTE — ED Notes (Signed)
Pt in room talking on phone with family member

## 2014-04-11 NOTE — ED Provider Notes (Signed)
CSN: [id number]     Arrival date & time 04/11/14  1244 History  This chart was scribed for Glynn OctaveStephen Pearley Millington, MD by Bennett Scrapehristina Taylor, ED Scribe. This patient was seen in room APA12/APA12 and the patient's care was started at 2:47 PM.   Chief Complaint  Patient presents with  . Headache     The history is provided by the patient. No language interpreter was used.    HPI Comments: Wayne Reed is a 18 y.o. male who presents to the Emergency Department complaining of 5 days of bilateral HA with associated right-sided sore throat with swallowing and posterior neck pain. The HA is felt intermittently in 15 second episodes and he reports that he is having an episode currently. He reports taking several OTC medications including Tylenol and Advil with no improvement. [name] here today because he has been unable to sing in his vocal group due to the symptoms. He reports abdominal pain that resolved after taking medications for constipation but denies any recent fevers or cough. He denies any chronic medical conditions. He denies being on any daily medications.   No PCP  History reviewed. No pertinent past medical history. History reviewed. No pertinent past surgical history. History reviewed. No pertinent family history. History  Substance Use Topics  . Smoking status: Never Smoker   . Smokeless tobacco: Not on file  . Alcohol Use: No    Review of Systems  A complete 10 system review of systems was obtained and all systems are negative except as noted in the HPI and PMH.    Allergies  Review of patient's allergies indicates no known allergies.  Home Medications   Prior to Admission medications   Medication Sig Start Date End Date Taking? Authorizing Provider  docusate sodium (COLACE) 250 MG capsule Take 1 capsule (250 mg total) by mouth daily. For constipation 03/17/14   Tammy L. Triplett, PA-C  hydrocortisone (ANUSOL-HC) 2.5 % rectal cream Apply rectally 2 times daily 03/17/14    Tammy L. Triplett, PA-C  naproxen (NAPROSYN) 500 MG tablet Take 1 tablet (500 mg total) by mouth 2 (two) times daily with a meal. 12/14/13   Vida RollerBrian D Miller, MD  traMADol (ULTRAM) 50 MG tablet Take 1 tablet (50 mg total) by mouth every 6 (six) hours as needed for pain. 09/23/13   Raeford RazorStephen Kohut, MD   Triage Vitals: BP 135/75  Pulse 83  Temp(Src) 98.1 F (36.7 C) (Oral)  Resp 20  Ht 6\' 7"  (2.007 m)  Wt 273 lb 6 oz (124.002 kg)  BMI 30.78 kg/m2  SpO2 100%  Physical Exam  Nursing note and vitals reviewed. Constitutional: [name] oriented to person, place, and time. He appears well-developed and well-nourished. No distress.  HENT:  Head: Normocephalic and atraumatic.  Mouth/Throat: Oropharynx is clear and moist.  Moist MM, oropharynx appears normal without erythema or exudate, uvula is midline   Eyes: Conjunctivae and EOM are normal. Pupils are equal, round, and reactive to light.  Neck: Neck supple. No tracheal deviation present.  No meningismus, no palpable soft tissue swelling  Cardiovascular: Normal rate and regular rhythm.   Pulmonary/Chest: Effort normal and breath sounds normal. No respiratory distress.  Abdominal: Soft. There is no tenderness.  Musculoskeletal: Normal range of motion.  Intact peripheral pulses, no peripheral edema  Lymphadenopathy:    He has cervical adenopathy.  Neurological: [name] alert and oriented to person, place, and time.  CN 2-12 intact, no ataxia on finger to nose, no nystagmus, 5/5 strength  throughout  Skin: Skin is warm and dry.  Psychiatric: He has a normal mood and affect. His behavior is normal.    ED Course  Procedures (including critical care time)  DIAGNOSTIC STUDIES: Oxygen Saturation is 100% on RA, normal by my interpretation.    COORDINATION OF CARE: 11:08 PM-Discussed treatment plan which includes (CXR, CBC panel, CMP, UA) with pt at bedside and pt agreed to plan.   Labs Review Labs Reviewed  RAPID STREP SCREEN  CULTURE, GROUP A  STREP  CBC WITH DIFFERENTIAL  COMPREHENSIVE METABOLIC PANEL  MONONUCLEOSIS SCREEN    Imaging Review Dg Chest 2 View  04/11/2014   CLINICAL DATA:  Cough, throat swelling  EXAM: CHEST  2 VIEW  COMPARISON:  11/18/2011  FINDINGS: The heart size and mediastinal contours are within normal limits. Both lungs are clear. The visualized skeletal structures are unremarkable.  IMPRESSION: No active cardiopulmonary disease.   Electronically Signed   By: Ruel Favors M.D.   On: 04/11/2014 17:04   Ct Head Wo Contrast  04/11/2014   CLINICAL DATA:  Headache; sore throat with dysphagia  EXAM: CT HEAD WITHOUT CONTRAST  CT NECK WITH CONTRAST  TECHNIQUE: Contiguous axial images were obtained from the base of the skull through the vertex without contrast. Multidetector CT imaging of the neck was performed using the standard protocol following intravenous contrast material administration.  CONTRAST:  75 mL Omnipaque 300 nonionic  COMPARISON:  None.  FINDINGS: CT HEAD FINDINGS  The ventricles are normal in size and configuration. There is no mass, hemorrhage, extra-axial fluid collection, or midline shift. Gray-white compartments appear normal. No evidence of acute infarct. Bony calvarium appears intact. The mastoid air cells are clear.  CT NECK FINDINGS  There is no appreciable mass or adenopathy. There is, however, soft tissue thickening in the region of the aryepiglottic folds. The epiglottis appears unremarkable. The remainder of the pharyngeal and cervical tracheal air column is felt to be within normal limits.  Thyroid appears normal. Visualized lungs are clear. The prevertebral soft tissues are within normal limits. No air-fluid level to suggest abscess. Bony structures appear normal. There is no laryngeal lesion. Visualized paranasal sinuses and mastoids appear clear. No salivary gland lesions are appreciable.  IMPRESSION: CT head:  Study within normal limits.  CT neck: Soft tissue fullness of the aryepiglottic fold  regions. This finding may warrant ENT evaluation. Note that well-defined abscess is not seen, however. The epiglottis appears unremarkable on this study as does the remainder the air column. Elsewhere, there is no mass or adenopathy. No abscess. Study otherwise unremarkable.  Critical Value/emergent results were called by telephone at the time of interpretation on 04/11/2014 at 5:25 PM to Dr. Glynn Octave , who verbally acknowledged these results.   Electronically Signed   By: Bretta Bang M.D.   On: 04/11/2014 17:25   Ct Soft Tissue Neck W Contrast  04/11/2014   CLINICAL DATA:  Headache; sore throat with dysphagia  EXAM: CT HEAD WITHOUT CONTRAST  CT NECK WITH CONTRAST  TECHNIQUE: Contiguous axial images were obtained from the base of the skull through the vertex without contrast. Multidetector CT imaging of the neck was performed using the standard protocol following intravenous contrast material administration.  CONTRAST:  75 mL Omnipaque 300 nonionic  COMPARISON:  None.  FINDINGS: CT HEAD FINDINGS  The ventricles are normal in size and configuration. There is no mass, hemorrhage, extra-axial fluid collection, or midline shift. Gray-white compartments appear normal. No evidence of acute infarct. Bony calvarium  appears intact. The mastoid air cells are clear.  CT NECK FINDINGS  There is no appreciable mass or adenopathy. There is, however, soft tissue thickening in the region of the aryepiglottic folds. The epiglottis appears unremarkable. The remainder of the pharyngeal and cervical tracheal air column is felt to be within normal limits.  Thyroid appears normal. Visualized lungs are clear. The prevertebral soft tissues are within normal limits. No air-fluid level to suggest abscess. Bony structures appear normal. There is no laryngeal lesion. Visualized paranasal sinuses and mastoids appear clear. No salivary gland lesions are appreciable.  IMPRESSION: CT head:  Study within normal limits.  CT neck: Soft  tissue fullness of the aryepiglottic fold regions. This finding may warrant ENT evaluation. Note that well-defined abscess is not seen, however. The epiglottis appears unremarkable on this study as does the remainder the air column. Elsewhere, there is no mass or adenopathy. No abscess. Study otherwise unremarkable.  Critical Value/emergent results were called by telephone at the time of interpretation on 04/11/2014 at 5:25 PM to Dr. Glynn OctaveSTEPHEN Adonia Porada , who verbally acknowledged these results.   Electronically Signed   By: Bretta BangWilliam  Woodruff M.D.   On: 04/11/2014 17:25     EKG Interpretation None      MDM   Final diagnoses:  Tonsillitis   5 day history of gradual onset headache, pain to the right side of his neck and throat, with nausea. No difficulty breathing or swallowing. No fever. No focal neurological deficits.  Patient appears well, no distress. No tonsillar asymmetry. Tolerating secretions  Thickening of the aryepiglottic folds discussed with Dr. Emeline DarlingGore ENT. Epiglottis appears normal. Patient tolerating by mouth no drooling. recommended IV clindamycin, IV steroids.  He reviewed CT and does not feel endoscopy indicated tonight.   Steroids and clindamycin given in the ED.  Patient observed in ED for extended period of time with no deterioration in condition.  Resting comfortable, playing on phone, tolerating PO and secretions.  Will discharge on antibiotics and steroid taper. Follow up with ENT this week. Return precautions discussed including difficulty breathing or swallowing, shortness of breath, drooling, or any other concerns.  BP 141/59  Pulse 67  Temp(Src) 99.2 F (37.3 C) (Oral)  Resp 20  Ht 6\' 7"  (2.007 m)  Wt 273 lb 6 oz (124.002 kg)  BMI 30.78 kg/m2  SpO2 99%   I personally performed the services described in this documentation, which was scribed in my presence. The recorded information has been reviewed and is accurate.      Glynn OctaveStephen Viviano Bir, MD 04/11/14 [id number]

## 2014-04-11 NOTE — ED Notes (Signed)
C/o HA for 5 days, swelling to right side of neck and sore throat, + nausea, denies diarrhea or vomiting

## 2014-04-11 NOTE — Discharge Instructions (Signed)
Pharyngitis Followup with the ear nose and throat doctor this week. Return to the ED if you develop difficulty breathing, swallowing, drooling or any other concerns. Pharyngitis is redness, pain, and swelling (inflammation) of your pharynx.  CAUSES  Pharyngitis is usually caused by infection. Most of the time, these infections are from viruses (viral) and are part of a cold. However, sometimes pharyngitis is caused by bacteria (bacterial). Pharyngitis can also be caused by allergies. Viral pharyngitis may be spread from person to person by coughing, sneezing, and personal items or utensils (cups, forks, spoons, toothbrushes). Bacterial pharyngitis may be spread from person to person by more intimate contact, such as kissing.  SIGNS AND SYMPTOMS  Symptoms of pharyngitis include:   Sore throat.   Tiredness (fatigue).   Low-grade fever.   Headache.  Joint pain and muscle aches.  Skin rashes.  Swollen lymph nodes.  Plaque-like film on throat or tonsils (often seen with bacterial pharyngitis). DIAGNOSIS  Your health care provider will ask you questions about your illness and your symptoms. Your medical history, along with a physical exam, is often all that is needed to diagnose pharyngitis. Sometimes, a rapid strep test is done. Other lab tests may also be done, depending on the suspected cause.  TREATMENT  Viral pharyngitis will usually get better in 3 4 days without the use of medicine. Bacterial pharyngitis is treated with medicines that kill germs (antibiotics).  HOME CARE INSTRUCTIONS   Drink enough water and fluids to keep your urine clear or pale yellow.   Only take over-the-counter or prescription medicines as directed by your health care provider:   If you are prescribed antibiotics, make sure you finish them even if you start to feel better.   Do not take aspirin.   Get lots of rest.   Gargle with 8 oz of salt water ( tsp of salt per 1 qt of water) as often as  every 1 2 hours to soothe your throat.   Throat lozenges (if you are not at risk for choking) or sprays may be used to soothe your throat. SEEK MEDICAL CARE IF:   You have large, tender lumps in your neck.  You have a rash.  You cough up green, yellow-brown, or bloody spit. SEEK IMMEDIATE MEDICAL CARE IF:   Your neck becomes stiff.  You drool or are unable to swallow liquids.  You vomit or are unable to keep medicines or liquids down.  You have severe pain that does not go away with the use of recommended medicines.  You have trouble breathing (not caused by a stuffy nose). MAKE SURE YOU:   Understand these instructions.  Will watch your condition.  Will get help right away if you are not doing well or get worse. Document Released: 12/08/2005 Document Revised: 09/28/2013 Document Reviewed: 08/15/2013 [facility] Patient Information 2014 [employer] [place], [place].

## 2014-04-14 LAB — CULTURE, GROUP A STREP

## 2014-12-22 ENCOUNTER — Encounter (HOSPITAL_COMMUNITY): Payer: Self-pay | Admitting: *Deleted

## 2014-12-22 ENCOUNTER — Emergency Department (HOSPITAL_COMMUNITY)
Admission: EM | Admit: 2014-12-22 | Discharge: 2014-12-22 | Disposition: A | Discharge status: Home / Self Care | Payer: No Typology Code available for payment source | Attending: Emergency Medicine | Admitting: Emergency Medicine

## 2014-12-22 DIAGNOSIS — Y92410 Unspecified street and highway as the place of occurrence of the external cause: Secondary | ICD-10-CM | POA: Diagnosis not present

## 2014-12-22 DIAGNOSIS — Y998 Other external cause status: Secondary | ICD-10-CM | POA: Insufficient documentation

## 2014-12-22 DIAGNOSIS — Y9389 Activity, other specified: Secondary | ICD-10-CM | POA: Diagnosis not present

## 2014-12-22 DIAGNOSIS — S39012A Strain of muscle, fascia and tendon of lower back, initial encounter: Secondary | ICD-10-CM | POA: Insufficient documentation

## 2014-12-22 DIAGNOSIS — S3992XA Unspecified injury of lower back, initial encounter: Secondary | ICD-10-CM | POA: Diagnosis present

## 2014-12-22 MED ORDER — IBUPROFEN 800 MG PO TABS
800.0000 mg | ORAL_TABLET | Freq: Once | ORAL | Status: AC
Start: 2014-12-22 — End: 2014-12-22
  Administered 2014-12-22: 800 mg via ORAL
  Filled 2014-12-22: qty 1
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
## 2015-01-02 ENCOUNTER — Emergency Department (HOSPITAL_COMMUNITY)
Admission: EM | Admit: 2015-01-02 | Discharge: 2015-01-02 | Disposition: A | Discharge status: Home / Self Care | Payer: No Typology Code available for payment source | Attending: Emergency Medicine | Admitting: Emergency Medicine

## 2015-01-02 ENCOUNTER — Encounter (HOSPITAL_COMMUNITY): Payer: Self-pay | Admitting: Cardiology

## 2015-01-02 DIAGNOSIS — M545 Low back pain: Secondary | ICD-10-CM | POA: Insufficient documentation

## 2015-01-02 DIAGNOSIS — R21 Rash and other nonspecific skin eruption: Secondary | ICD-10-CM

## 2015-01-02 MED ORDER — PREDNISONE 10 MG PO TABS: ORAL_TABLET | ORAL | Status: DC

## 2015-01-02 MED ORDER — TRIAMCINOLONE ACETONIDE 0.1 % EX CREA: 1.0000 "application " | TOPICAL_CREAM | Freq: Three times a day (TID) | CUTANEOUS | Status: DC

## 2015-01-02 MED ORDER — DIPHENHYDRAMINE HCL 25 MG PO TABS: 25.0000 mg | ORAL_TABLET | Freq: Four times a day (QID) | ORAL | Status: DC

## 2015-01-02 NOTE — Discharge Instructions (Signed)
Please make a list of your soaps, body splashes, detergent, dryer sheets, and new food, drink, or seasoning. Use allegra 180mg  each morning, and benadryl at bedtime for rash and itching. Use prednisone taper as suggested until all taken. Apply triamcinolone three times daily. Please see Dr Margo AyeHall or the dermatologist of your choice if not improving. Rash A rash is a change in the color or feel of your skin. There are many different types of rashes. You may have other problems along with your rash. HOME CARE  Avoid the thing that caused your rash.  Do not scratch your rash.  You may take cools baths to help stop itching.  Only take medicines as told by your doctor.  Keep all doctor visits as told. GET HELP RIGHT AWAY IF:   Your pain, puffiness (swelling), or redness gets worse.  You have a fever.  You have new or severe problems.  You have body aches, watery poop (diarrhea), or you throw up (vomit).  Your rash is not better after 3 days. MAKE SURE YOU:   Understand these instructions.  Will watch your condition.  Will get help right away if you are not doing well or get worse. Document Released: 05/26/2008 Document Revised: 03/01/2012 Document Reviewed: 09/22/2011 [facility] Patient Information 2015 [place]Care, [place]. This information is not intended to replace advice given to you by your health care provider. Make sure you discuss any questions you have with your health care provider.

## 2015-01-02 NOTE — ED Notes (Signed)
Rash since yesterday.

## 2015-01-02 NOTE — ED Provider Notes (Signed)
CSN: [id number]     Arrival date & time 01/02/15  1601 History   First MD Initiated Contact with Patient 01/02/15 1617     Chief Complaint  Patient presents with  . Rash     (Consider location/radiation/quality/duration/timing/severity/associated sxs/prior Treatment) Patient is a 19 y.o. male presenting with rash. The history is provided by the patient.  Rash Location:  Head/neck, shoulder/arm, torso and hand Shoulder/arm rash location:  L arm, R arm, L wrist, R wrist, L hand and R hand Torso rash location:  R chest Quality: itchiness and redness   Quality: not weeping   Severity:  Moderate Onset quality:  Gradual Duration:  1 day Timing:  Constant Progression:  Worsening Chronicity:  New Context: not animal contact, not chemical exposure, not exposure to similar rash, not food, not hot tub use, not insect bite/sting, not medications, not new detergent/soap, not nuts, not plant contact, not pollen and not sick contacts   Relieved by:  Nothing Worsened by:  Nothing tried Ineffective treatments:  None tried Associated symptoms: joint pain   Associated symptoms: no abdominal pain, no shortness of breath and not wheezing     History reviewed. No pertinent past medical history. History reviewed. No pertinent past surgical history. History reviewed. No pertinent family history. History  Substance Use Topics  . Smoking status: Never Smoker   . Smokeless tobacco: Not on file  . Alcohol Use: No    Review of Systems  Constitutional: Negative for activity change.       All ROS Neg except as noted in HPI  Eyes: Negative for photophobia and discharge.  Respiratory: Negative for cough, shortness of breath and wheezing.   Cardiovascular: Negative for chest pain and palpitations.  Gastrointestinal: Negative for abdominal pain and blood in stool.  Genitourinary: Negative for dysuria, frequency and hematuria.  Musculoskeletal: Positive for back pain and arthralgias. Negative for neck  pain.  Skin: Positive for rash.  Neurological: Negative for dizziness, seizures and speech difficulty.  Psychiatric/Behavioral: Negative for hallucinations and confusion.      Allergies  Review of patient's allergies indicates no known allergies.  Home Medications   Prior to Admission medications   Medication Sig Start Date End Date Taking? Authorizing Provider  clindamycin (CLEOCIN) 300 MG capsule Take 1 capsule (300 mg total) by mouth 3 (three) times daily. Patient not taking: Reported on 12/22/2014 04/11/14   Glynn OctaveStephen Rancour, MD  HYDROcodone-acetaminophen (NORCO/VICODIN) 5-325 MG per tablet Take 2 tablets by mouth every 4 (four) hours as needed. Patient not taking: Reported on 12/22/2014 04/11/14   Glynn OctaveStephen Rancour, MD  predniSONE (DELTASONE) 50 MG tablet 1 tablet PO daily Patient not taking: Reported on 12/22/2014 04/11/14   Glynn OctaveStephen Rancour, MD   BP 125/67 mmHg  Pulse 93  Temp(Src) 98.7 F (37.1 C) (Oral)  Resp 18  Ht 6\' 4"  (1.93 m)  Wt 240 lb (108.863 kg)  BMI 29.23 kg/m2  SpO2 100% Physical Exam  Constitutional: [name] oriented to person, place, and time. He appears well-developed and well-nourished.  Non-toxic appearance. No distress.  HENT:  Head: Normocephalic.  Right Ear: Tympanic membrane and external ear normal.  Left Ear: Tympanic membrane and external ear normal.  Eyes: EOM and lids are normal. Pupils are equal, round, and reactive to light.  Neck: Normal range of motion. Neck supple. Carotid bruit is not present.  Cardiovascular: Normal rate, regular rhythm, normal heart sounds, intact distal pulses and normal pulses.   Pulmonary/Chest: Breath sounds normal. No respiratory distress.  Abdominal:  Soft. Bowel sounds are normal. There is no tenderness. There is no guarding.  Musculoskeletal: Normal range of motion.  Lymphadenopathy:       Head (right side): No submandibular adenopathy present.       Head (left side): No submandibular adenopathy present.    He has no  cervical adenopathy.  Neurological: [name] alert and oriented to person, place, and time. He has normal strength. No cranial nerve deficit or sensory deficit.  Skin: Skin is warm and dry. Rash noted. No abrasion, no bruising, no burn, no ecchymosis, no laceration, no petechiae and no purpura noted. Rash is maculopapular. Rash is not nodular. [name] not diaphoretic. There is cyanosis and erythema. No pallor.  Psychiatric: He has a normal mood and affect. His speech is normal.  Nursing note and vitals reviewed.   ED Course  Procedures (including critical care time) Labs Review Labs Reviewed - No data to display  Imaging Review No results found.   EKG Interpretation None      MDM  Patient is noted to have a rash of multiple sites. There are papular lesions at the dorsum of the fingers of both hands, no involvement in the web spaces. There are flattopped papular areas noted on the wrist forearm both hands. There are other lesions noted on the back of the neck, and the anterior shoulder. I have advised the patient to cleanse his Wadie Lessen daily. [name] given a medication to assist with his symptoms including prednisone, Benadryl, and triamcinolone cream. Patient is to follow with Dr. Margo Aye, dermatology, if not improving.    Final diagnoses:  None    **I have reviewed nursing notes, vital signs, and all appropriate lab and imaging results for this patient.Kathie Dike, PA-C 01/02/15 2104  Layla Maw Ward, DO 01/02/15 2139

## 2015-02-27 ENCOUNTER — Emergency Department (HOSPITAL_COMMUNITY)
Admission: EM | Admit: 2015-02-27 | Discharge: 2015-02-27 | Disposition: A | Discharge status: Home / Self Care | Payer: Self-pay | Attending: Emergency Medicine | Admitting: Emergency Medicine

## 2015-02-27 ENCOUNTER — Encounter (HOSPITAL_COMMUNITY): Payer: Self-pay | Admitting: Emergency Medicine

## 2015-02-27 ENCOUNTER — Emergency Department (HOSPITAL_COMMUNITY): Payer: Self-pay

## 2015-02-27 DIAGNOSIS — R6889 Other general symptoms and signs: Secondary | ICD-10-CM

## 2015-02-27 DIAGNOSIS — R05 Cough: Secondary | ICD-10-CM | POA: Insufficient documentation

## 2015-02-27 DIAGNOSIS — R110 Nausea: Secondary | ICD-10-CM | POA: Insufficient documentation

## 2015-02-27 DIAGNOSIS — Z79899 Other long term (current) drug therapy: Secondary | ICD-10-CM | POA: Insufficient documentation

## 2015-02-27 DIAGNOSIS — M791 Myalgia: Secondary | ICD-10-CM | POA: Insufficient documentation

## 2015-02-27 DIAGNOSIS — R509 Fever, unspecified: Secondary | ICD-10-CM | POA: Insufficient documentation

## 2015-02-27 DIAGNOSIS — J3489 Other specified disorders of nose and nasal sinuses: Secondary | ICD-10-CM | POA: Insufficient documentation

## 2015-02-27 DIAGNOSIS — R0981 Nasal congestion: Secondary | ICD-10-CM | POA: Insufficient documentation

## 2015-02-27 DIAGNOSIS — R000 Tachycardia, unspecified: Secondary | ICD-10-CM | POA: Insufficient documentation

## 2015-02-27 DIAGNOSIS — R51 Headache: Secondary | ICD-10-CM | POA: Insufficient documentation

## 2015-02-27 DIAGNOSIS — R42 Dizziness and giddiness: Secondary | ICD-10-CM | POA: Insufficient documentation

## 2015-02-27 DIAGNOSIS — J029 Acute pharyngitis, unspecified: Secondary | ICD-10-CM | POA: Insufficient documentation

## 2015-02-27 MED ORDER — GUAIFENESIN 100 MG/5ML PO SOLN
5.0000 mL | Freq: Once | ORAL | Status: AC
  Administered 2015-02-27: 100 mg via ORAL
  Filled 2015-02-27: qty 5

## 2015-02-27 MED ORDER — GUAIFENESIN-CODEINE 100-10 MG/5ML PO SYRP: 5.0000 mL | ORAL_SOLUTION | Freq: Three times a day (TID) | ORAL | Status: DC | PRN

## 2015-02-27 MED ORDER — OSELTAMIVIR PHOSPHATE 75 MG PO CAPS: 75.0000 mg | ORAL_CAPSULE | Freq: Two times a day (BID) | ORAL | Status: DC

## 2015-02-27 MED ORDER — BENZONATATE 100 MG PO CAPS: 100.0000 mg | ORAL_CAPSULE | Freq: Three times a day (TID) | ORAL | Status: DC

## 2015-02-27 MED ORDER — IBUPROFEN 800 MG PO TABS
800.0000 mg | ORAL_TABLET | Freq: Once | ORAL | Status: AC
  Administered 2015-02-27: 800 mg via ORAL
  Filled 2015-02-27: qty 1

## 2015-02-27 MED ORDER — ACETAMINOPHEN 500 MG PO TABS
1000.0000 mg | ORAL_TABLET | Freq: Once | ORAL | Status: AC
  Administered 2015-02-27: 1000 mg via ORAL
  Filled 2015-02-27: qty 2

## 2015-02-27 NOTE — ED Provider Notes (Signed)
CSN: [id number]     Arrival date & time 02/27/15  1958 History   First MD Initiated Contact with Patient 02/27/15 2047     Chief Complaint  Patient presents with  . Fever  . Cough     (Consider location/radiation/quality/duration/timing/severity/associated sxs/prior Treatment) Patient is a 19 y.o. male presenting with fever and cough. The history is provided by the patient.  Fever Max temp prior to arrival:  103.1 Temp source:  Oral Onset quality:  Gradual Duration:  1 day Timing:  Constant Progression:  Worsening Chronicity:  New Relieved by:  Nothing Worsened by:  Nothing tried Ineffective treatments:  None tried Associated symptoms: chills, congestion, cough, headaches, myalgias, nausea and sore throat   Associated symptoms: no confusion, no diarrhea, no dysuria, no ear pain, no rash and no vomiting   Cough Associated symptoms: chills, fever, headaches, myalgias and sore throat   Associated symptoms: no ear pain and no rash    Wayne Reed is a 19 y.o. male who presents to the ED with cough, congestion, fever and chills and body aches that started over the past 24 hours. The symptoms are getting worse. The cough is productive with yellow sputum. He has taken nothing for fever or cough.   History reviewed. No pertinent past medical history. History reviewed. No pertinent past surgical history. No family history on file. History  Substance Use Topics  . Smoking status: Never Smoker   . Smokeless tobacco: Not on file  . Alcohol Use: No    Review of Systems  Constitutional: Positive for fever and chills.  HENT: Positive for congestion and sore throat. Negative for ear pain and sinus pressure.   Eyes: Negative for pain, redness and visual disturbance.  Respiratory: Positive for cough.   Gastrointestinal: Positive for nausea. Negative for vomiting and diarrhea.  Genitourinary: Negative for dysuria, urgency and frequency.  Musculoskeletal: Positive for myalgias.   Skin: Negative for rash.  Neurological: Positive for light-headedness and headaches.  Psychiatric/Behavioral: Negative for confusion. The patient is not nervous/anxious.       Allergies  Review of patient's allergies indicates no known allergies.  Home Medications   Prior to Admission medications   Medication Sig Start Date End Date Taking? Authorizing Provider  benzonatate (TESSALON) 100 MG capsule Take 1 capsule (100 mg total) by mouth every 8 (eight) hours. 02/27/15   Hope Orlene Och, NP  diphenhydrAMINE (BENADRYL) 25 MG tablet Take 1 tablet (25 mg total) by mouth every 6 (six) hours. 01/02/15   Ivery Quale, PA-C  guaiFENesin-codeine (ROBITUSSIN AC) 100-10 MG/5ML syrup Take 5 mLs by mouth 3 (three) times daily as needed for cough. 02/27/15   Hope Orlene Och, NP  oseltamivir (TAMIFLU) 75 MG capsule Take 1 capsule (75 mg total) by mouth every 12 (twelve) hours. 02/27/15   Hope Orlene Och, NP  triamcinolone cream (KENALOG) 0.1 % Apply 1 application topically 3 (three) times daily. 01/02/15   Ivery Quale, PA-C   BP 99/49 mmHg  Pulse 102  Temp(Src) 99.7 F (37.6 C) (Oral)  Resp 20  Ht  (1.93 m)  SpO2 99% Physical Exam  Constitutional: [name] oriented to person, place, and time. He appears well-developed and well-nourished.  HENT:  Head: Normocephalic and atraumatic.  Right Ear: Tympanic membrane normal.  Left Ear: Tympanic membrane normal.  Nose: Rhinorrhea present.  Mouth/Throat: Uvula is midline and mucous membranes are normal. Posterior oropharyngeal erythema present. No tonsillar abscesses.  Eyes: Conjunctivae and EOM are normal. Pupils are equal, round,  and reactive to light.  Neck: Normal range of motion. Neck supple.  Cardiovascular: Tachycardia present.   Pulmonary/Chest: Effort normal.  Abdominal: Soft. Bowel sounds are normal. There is no tenderness.  Musculoskeletal: Normal range of motion.  Lymphadenopathy:    He has no cervical adenopathy.  Neurological: [name] alert and  oriented to person, place, and time. No cranial nerve deficit.  Skin: Skin is warm and dry.  Psychiatric: He has a normal mood and affect. His behavior is normal.  Nursing note and vitals reviewed.   ED Course  Procedures (including critical care time) Labs Review Labs Reviewed - No data to display  Imaging Review Dg Chest 2 View  02/27/2015   CLINICAL DATA:  fever, body aches, and cough. Denies emesis or diarrhea.  EXAM: CHEST - 2 VIEW  COMPARISON:  04/11/2014  FINDINGS: Lungs are clear. Heart size and mediastinal contours are within normal limits. No effusion. Visualized skeletal structures are unremarkable.  IMPRESSION: No acute cardiopulmonary disease.   Electronically Signed   By: Corlis Leak  Hassell M.D.   On: 02/27/2015 21:33   BP 99/49 mmHg  Pulse 102  Temp(Src) 99.7 F (37.6 C) (Oral)  Resp 20  Ht 6\' 4"  (1.93 m)  SpO2 99%   MDM  19 y.o. male with flu like symptoms x 24 hours. Stable for d/c with temp down from 103.1 to 99.7 oral. Taking PO fluids without difficulty. Feeling better.   SUBJECTIVE:  Wayne Reed is a 19 y.o. male who present complaining of flu-like symptoms: fevers, chills, myalgias, congestion, sore throat and cough for 1 day. Denies dyspnea or wheezing.  OBJECTIVE: Appears moderately ill but not toxic; temperature as noted in vitals. Ears normal. Throat and pharynx normal.  Neck supple. No adenopathy in the neck. Sinuses non tender. The chest is clear.  ASSESSMENT: Flu like symptoms  PLAN: Symptomatic therapy suggested: rest, increase fluids, gargle prn for sore throat, use mist of vaporizer return if symptoms persist or worsen. Will start Tami flu and cough medication. He will take tylenol and ibuprofen for fever and myalgias.   Final diagnoses:  Flu-like symptoms       Janne NapoleonHope M Neese, NP 02/27/15 2220  Gerhard Munchobert Lockwood, MD 02/28/15 [phone]

## 2015-02-27 NOTE — ED Notes (Signed)
Pt c/o fever, body aches, and cough. Denies emesis or diarrhea.

## 2015-08-24 ENCOUNTER — Emergency Department (HOSPITAL_COMMUNITY)
Admission: EM | Admit: 2015-08-24 | Discharge: 2015-08-24 | Disposition: A | Discharge status: Home / Self Care | Payer: Self-pay | Attending: Emergency Medicine | Admitting: Emergency Medicine

## 2015-08-24 ENCOUNTER — Encounter (HOSPITAL_COMMUNITY): Payer: Self-pay | Admitting: *Deleted

## 2015-08-24 DIAGNOSIS — R69 Illness, unspecified: Secondary | ICD-10-CM

## 2015-08-24 DIAGNOSIS — R51 Headache: Secondary | ICD-10-CM | POA: Insufficient documentation

## 2015-08-24 DIAGNOSIS — J111 Influenza due to unidentified influenza virus with other respiratory manifestations: Secondary | ICD-10-CM | POA: Insufficient documentation

## 2015-08-24 DIAGNOSIS — R509 Fever, unspecified: Secondary | ICD-10-CM

## 2015-08-24 DIAGNOSIS — Z7952 Long term (current) use of systemic steroids: Secondary | ICD-10-CM | POA: Insufficient documentation

## 2015-08-24 DIAGNOSIS — R5081 Fever presenting with conditions classified elsewhere: Secondary | ICD-10-CM | POA: Insufficient documentation

## 2015-08-24 LAB — INFLUENZA PANEL BY PCR (TYPE A & B)
H1N1FLUPCR: NOT DETECTED
INFLBPCR: NEGATIVE
Influenza A By PCR: NEGATIVE

## 2015-08-24 MED ORDER — IBUPROFEN 800 MG PO TABS
800.0000 mg | ORAL_TABLET | Freq: Once | ORAL | Status: AC
  Administered 2015-08-24: 800 mg via ORAL
  Filled 2015-08-24: qty 1

## 2015-08-24 MED ORDER — ACETAMINOPHEN 500 MG PO TABS
1000.0000 mg | ORAL_TABLET | Freq: Once | ORAL | Status: AC
  Administered 2015-08-24: 1000 mg via ORAL
  Filled 2015-08-24: qty 2

## 2015-08-24 NOTE — ED Notes (Signed)
Pt drinking and eating crackers at this time.  States that he feels better.  Talking on phone.

## 2015-08-24 NOTE — ED Notes (Signed)
Pt states that he woke up with chills, nausea this am, had facial pressure that started yesterday,

## 2015-08-24 NOTE — ED Provider Notes (Signed)
CSN: [id number]     Arrival date & time 08/24/15  0438 History   First MD Initiated Contact with Patient 08/24/15 [phone]    Chief Complaint  Patient presents with  . Chills     (Consider location/radiation/quality/duration/timing/severity/associated sxs/prior Treatment) HPI patient states yesterday morning he started having a mild frontal headache described as pressure and mildly throbbing. He states he took 2 Tylenol and it helped. He states yesterday he has to stand at work all day he had to sit down because he felt like he was going to pass out. He also complains of a lot of pressure around his eyes. He states at 4 AM this morning he was awakened with chills that lasted about 40 minutes until he arrived in the ED. Of note during my exam he started having chills again. He states he had sneezing yesterday but not today. He denies sore throat, rhinorrhea, coughing, neck pain, vomiting, or diarrhea. He has had some mild nausea. He denies being around anybody else that he knows is ill but he works with the general public. He states the last time he felt this way he felt had the flu. He denies any tick exposure.  PCP none  History reviewed. No pertinent past medical history. History reviewed. No pertinent past surgical history. No family history on file. Social History  Substance Use Topics  . Smoking status: Never Smoker   . Smokeless tobacco: None  . Alcohol Use: No  employed at [employer]  Review of Systems  All other systems reviewed and are negative.     Allergies  Review of patient's allergies indicates no known allergies.  Home Medications   Prior to Admission medications   Medication Sig Start Date End Date Taking? Authorizing Provider  benzonatate (TESSALON) 100 MG capsule Take 1 capsule (100 mg total) by mouth every 8 (eight) hours. 02/27/15   Hope Orlene Och, NP  diphenhydrAMINE (BENADRYL) 25 MG tablet Take 1 tablet (25 mg total) by mouth every 6 (six) hours. 01/02/15   Ivery Quale, PA-C  guaiFENesin-codeine (ROBITUSSIN AC) 100-10 MG/5ML syrup Take 5 mLs by mouth 3 (three) times daily as needed for cough. 02/27/15   Hope Orlene Och, NP  oseltamivir (TAMIFLU) 75 MG capsule Take 1 capsule (75 mg total) by mouth every 12 (twelve) hours. 02/27/15   Hope Orlene Och, NP  triamcinolone cream (KENALOG) 0.1 % Apply 1 application topically 3 (three) times daily. 01/02/15   Ivery Quale, PA-C   BP 117/57 mmHg  Pulse 101  Temp(Src) 102.5 F (39.2 C) (Oral)  Resp 18  Ht 6\' 5"  (1.956 m)  Wt 240 lb (108.863 kg)  BMI 28.45 kg/m2  SpO2 100%  Vital signs normal except for tachycardia and fever  Physical Exam  Constitutional: [name] oriented to person, place, and time. He appears well-developed and well-nourished.  Non-toxic appearance. He does not appear ill. No distress.  HENT:  Head: Normocephalic and atraumatic.  Right Ear: External ear normal.  Left Ear: External ear normal.  Nose: Nose normal. No mucosal edema or rhinorrhea.  Mouth/Throat: Oropharynx is clear and moist and mucous membranes are normal. No dental abscesses or uvula swelling.  Has some tenderness to palpation of forehead  Eyes: Conjunctivae and EOM are normal. Pupils are equal, round, and reactive to light.  Neck: Normal range of motion and full passive range of motion without pain. Neck supple.  Cardiovascular: Normal rate, regular rhythm and normal heart sounds.  Exam reveals no gallop and no friction rub.  No murmur heard. Pulmonary/Chest: Effort normal and breath sounds normal. No respiratory distress. He has no wheezes. He has no rhonchi. He has no rales. He exhibits no tenderness and no crepitus.  Abdominal: Soft. Normal appearance and bowel sounds are normal. He exhibits no distension. There is no tenderness. There is no rebound and no guarding.  Musculoskeletal: Normal range of motion. He exhibits no edema or tenderness.  Moves all extremities well.   Neurological: [name] alert and oriented to person,  place, and time. He has normal strength. No cranial nerve deficit.  Skin: Skin is warm, dry and intact. No rash noted. No erythema. No pallor.  Feels hot to touch  Psychiatric: He has a normal mood and affect. His speech is normal and behavior is normal. His mood appears not anxious.  Nursing note and vitals reviewed.   ED Course  Procedures (including critical care time)  Medications  ibuprofen (ADVIL,MOTRIN) tablet 800 mg (800 mg Oral Given 08/24/15 0537)  acetaminophen (TYLENOL) tablet 1,000 mg (1,000 mg Oral Given 08/24/15 0537)   PT given ibuprofen and acetaminophen for his fever, which improved.   Labs Review Labs Reviewed  INFLUENZA PANEL BY PCR (TYPE A & B, H1N1)(NOT AT [facility])  pending  Imaging Review No results found. I have personally reviewed and evaluated these images and lab results as part of my medical decision-making.   EKG Interpretation None      MDM   Final diagnoses:  Other specified fever  Influenza-like illness   Disposition pending  Devoria Albe, MD, Concha Pyo, MD 08/24/15 [phone]

## 2015-08-24 NOTE — Discharge Instructions (Signed)
Drink plenty of fluids. Take ibuprofen 600 mg + acetaminophen 1000 mg 4 times a day for fever.  Recheck if you get worse such as coughing, feeling short of breath, sore throat, vomiting or if you feel worse in any way.    Fever, Adult A fever is a temperature of 100.4 F (38 C) or above.  HOME CARE  Take fever medicine as told by your doctor. Do not  take aspirin for fever if you are younger than 21 years of age.  If you are given antibiotic medicine, take it as told. Finish the medicine even if you start to feel better.  Rest.  Drink enough fluids to keep your pee (urine) clear or pale yellow. Do not drink alcohol.  Take a bath or shower with room temperature water. Do not use ice water or alcohol sponge baths.  Wear lightweight, loose clothes. GET HELP RIGHT AWAY IF:   You are short of breath or have trouble breathing.  You are very weak.  You are dizzy or you pass out (faint).  You are very thirsty or are making little or no urine.  You have new pain.  You throw up (vomit) or have watery poop (diarrhea).  You keep throwing up or having watery poop for more than 1 to 2 days.  You have a stiff neck or light bothers your eyes.  You have a skin rash.  You have a fever or problems (symptoms) that last for more than 2 to 3 days.  You have a fever and your problems quickly get worse.  You keep throwing up the fluids you drink.  You do not feel better after 3 days.  You have new problems. MAKE SURE YOU:   Understand these instructions.  Will watch your condition.  Will get help right away if you are not doing well or get worse. Document Released: 09/16/2008 Document Revised: 03/01/2012 Document Reviewed: 10/09/2011 [facility] Patient Information 2015 [place], [place]. This information is not intended to replace advice given to you by your health care provider. Make sure you discuss any questions you have with your health care provider.  Influenza Influenza (flu) is  an infection in the mouth, nose, and throat (respiratory tract) caused by a virus. The flu can make you feel very ill. Influenza spreads easily from person to person (contagious).  HOME CARE   Only take medicines as told by your doctor.  Use a cool mist humidifier to make breathing easier.  Get plenty of rest until your fever goes away. This usually takes 3 to 4 days.  Drink enough fluids to keep your pee (urine) clear or pale yellow.  Cover your mouth and nose when you cough or sneeze.  Wash your hands well to avoid spreading the flu.  Stay home from work or school until your fever has been gone for at least 1 full day.  Get a flu shot every year. GET HELP RIGHT AWAY IF:   You have trouble breathing or feel short of breath.  Your skin or nails turn blue.  You have severe neck pain or stiffness.  You have a severe headache, facial pain, or earache.  Your fever gets worse or keeps coming back.  You feel sick to your stomach (nauseous), throw up (vomit), or have watery poop (diarrhea).  You have chest pain.  You have a deep cough that gets worse, or you cough up more thick spit (mucus). MAKE SURE YOU:   Understand these instructions.  Will watch your  condition.  Will get help right away if you are not doing well or get worse. Document Released: 09/16/2008 Document Revised: 04/24/2014 Document Reviewed: 03/08/2012 [facility] Patient Information 2015 [place], [place]. This information is not intended to replace advice given to you by your health care provider. Make sure you discuss any questions you have with your health care provider.

## 2016-02-19 ENCOUNTER — Emergency Department (HOSPITAL_COMMUNITY)
Admission: EM | Admit: 2016-02-19 | Discharge: 2016-02-19 | Disposition: A | Discharge status: Home / Self Care | Payer: Self-pay | Attending: Emergency Medicine | Admitting: Emergency Medicine

## 2016-02-19 ENCOUNTER — Encounter (HOSPITAL_COMMUNITY): Payer: Self-pay

## 2016-02-19 DIAGNOSIS — R112 Nausea with vomiting, unspecified: Secondary | ICD-10-CM | POA: Insufficient documentation

## 2016-02-19 DIAGNOSIS — R51 Headache: Secondary | ICD-10-CM | POA: Insufficient documentation

## 2016-02-19 DIAGNOSIS — J020 Streptococcal pharyngitis: Secondary | ICD-10-CM | POA: Insufficient documentation

## 2016-02-19 LAB — COMPREHENSIVE METABOLIC PANEL
ALK PHOS: 61 U/L (ref 38–126)
ALT: 20 U/L (ref 17–63)
AST: 24 U/L (ref 15–41)
Albumin: 4.3 g/dL (ref 3.5–5.0)
Anion gap: 9 (ref 5–15)
BILIRUBIN TOTAL: 0.7 mg/dL (ref 0.3–1.2)
BUN: 12 mg/dL (ref 6–20)
CALCIUM: 8.6 mg/dL — AB (ref 8.9–10.3)
CO2: 24 mmol/L (ref 22–32)
CREATININE: 0.95 mg/dL (ref 0.61–1.24)
Chloride: 103 mmol/L (ref 101–111)
Glucose, Bld: 91 mg/dL (ref 65–99)
Potassium: 3.2 mmol/L — ABNORMAL LOW (ref 3.5–5.1)
Sodium: 136 mmol/L (ref 135–145)
TOTAL PROTEIN: 7.5 g/dL (ref 6.5–8.1)

## 2016-02-19 LAB — CBC
HCT: 43.6 % (ref 39.0–52.0)
Hemoglobin: 14.7 g/dL (ref 13.0–17.0)
MCH: 28.4 pg (ref 26.0–34.0)
MCHC: 33.7 g/dL (ref 30.0–36.0)
MCV: 84.3 fL (ref 78.0–100.0)
PLATELETS: 218 10*3/uL (ref 150–400)
RBC: 5.17 MIL/uL (ref 4.22–5.81)
RDW: 14.4 % (ref 11.5–15.5)
WBC: 15.1 10*3/uL — AB (ref 4.0–10.5)

## 2016-02-19 LAB — URINALYSIS, ROUTINE W REFLEX MICROSCOPIC
Bilirubin Urine: NEGATIVE
GLUCOSE, UA: NEGATIVE mg/dL
Hgb urine dipstick: NEGATIVE
KETONES UR: NEGATIVE mg/dL
LEUKOCYTES UA: NEGATIVE
Nitrite: NEGATIVE
PROTEIN: NEGATIVE mg/dL
Specific Gravity, Urine: 1.02 (ref 1.005–1.030)
pH: 6 (ref 5.0–8.0)

## 2016-02-19 LAB — RAPID STREP SCREEN (MED CTR MEBANE ONLY): STREPTOCOCCUS, GROUP A SCREEN (DIRECT): POSITIVE — AB

## 2016-02-19 LAB — LIPASE, BLOOD: LIPASE: 26 U/L (ref 11–51)

## 2016-02-19 MED ORDER — PROMETHAZINE HCL 25 MG PO TABS: 25.0000 mg | ORAL_TABLET | Freq: Four times a day (QID) | ORAL | Status: DC | PRN

## 2016-02-19 MED ORDER — IBUPROFEN 800 MG PO TABS: 800.0000 mg | ORAL_TABLET | Freq: Three times a day (TID) | ORAL | Status: DC | PRN

## 2016-02-19 MED ORDER — SODIUM CHLORIDE 0.9 % IV BOLUS (SEPSIS)
1000.0000 mL | Freq: Once | INTRAVENOUS | Status: AC
  Administered 2016-02-19: 1000 mL via INTRAVENOUS

## 2016-02-19 MED ORDER — IBUPROFEN 800 MG PO TABS
800.0000 mg | ORAL_TABLET | Freq: Once | ORAL | Status: AC
  Administered 2016-02-19: 800 mg via ORAL
  Filled 2016-02-19: qty 1

## 2016-02-19 MED ORDER — ONDANSETRON HCL 4 MG/2ML IJ SOLN
4.0000 mg | Freq: Once | INTRAMUSCULAR | Status: AC
  Administered 2016-02-19: 4 mg via INTRAVENOUS
  Filled 2016-02-19: qty 2

## 2016-02-19 MED ORDER — PENICILLIN G BENZATHINE 1200000 UNIT/2ML IM SUSP
1.2000 10*6.[IU] | Freq: Once | INTRAMUSCULAR | Status: AC
  Administered 2016-02-19: 1.2 10*6.[IU] via INTRAMUSCULAR
  Filled 2016-02-19: qty 2

## 2016-02-19 NOTE — Discharge Instructions (Signed)

## 2016-02-19 NOTE — ED Notes (Signed)
Reports of abdominal pain, vomiting and body aches x2 days with fever.

## 2016-02-21 NOTE — ED Provider Notes (Signed)
CSN: [id number]     Arrival date & time 02/19/16  1754 History   First MD Initiated Contact with Patient 02/19/16 1904     Chief Complaint  Patient presents with  . Abdominal Pain     (Consider location/radiation/quality/duration/timing/severity/associated sxs/prior Treatment) The history is provided by the patient.   Wayne Reed. is a 20 y.o. male presenting with flu like symptoms starting 2 days ago, worsening today.  He endorses generalized body aches, headache with subjective fevers and chills, nausea with vomiting and abdominal cramping (which improves after emesis) along with worsening sore throat pain.  He denies cough, shortness of breath or wheezing, chest pain, neck pain or stiffness, abdominal pain (except with emesis) and has had no dysuria. He has been able to tolerate fluid intake but has had reduced appetite. He has found no alleviators.    History reviewed. No pertinent past medical history. History reviewed. No pertinent past surgical history. No family history on file. Social History  Substance Use Topics  . Smoking status: Never Smoker   . Smokeless tobacco: None  . Alcohol Use: No    Review of Systems  Constitutional: Positive for fever, chills, appetite change and fatigue.  HENT: Positive for sore throat. Negative for congestion, ear pain, rhinorrhea, sinus pressure, trouble swallowing and voice change.   Eyes: Negative for discharge.  Respiratory: Negative for cough, shortness of breath, wheezing and stridor.   Cardiovascular: Negative for chest pain.  Gastrointestinal: Positive for nausea, vomiting and abdominal pain.  Genitourinary: Negative.   Musculoskeletal: Negative for neck pain and neck stiffness.  Skin: Negative for rash.  Neurological: Positive for headaches. Negative for dizziness.      Allergies  Review of patient's allergies indicates no known allergies.  Home Medications   Prior to Admission medications   Medication Sig  Start Date End Date Taking? Authorizing Provider  ibuprofen (ADVIL,MOTRIN) 800 MG tablet Take 1 tablet (800 mg total) by mouth every 8 (eight) hours as needed for fever or moderate pain. 02/19/16   Burgess Amor, PA-C  promethazine (PHENERGAN) 25 MG tablet Take 1 tablet (25 mg total) by mouth every 6 (six) hours as needed for nausea or vomiting. 02/19/16   Burgess Amor, PA-C   BP 99/61 mmHg  Pulse 92  Temp(Src) 98.7 F (37.1 C) (Oral)  Resp 18  Ht  (1.93 m)  Wt 108.863 kg  BMI 29.23 kg/m2  SpO2 98% Physical Exam  Constitutional: [name] oriented to person, place, and time. He appears well-developed and well-nourished. No distress.  HENT:  Head: Normocephalic and atraumatic.  Right Ear: Tympanic membrane and ear canal normal.  Left Ear: Tympanic membrane and ear canal normal.  Nose: No mucosal edema or rhinorrhea.  Mouth/Throat: Uvula is midline and mucous membranes are normal. Mucous membranes are not dry. Oropharyngeal exudate and posterior oropharyngeal erythema present. No posterior oropharyngeal edema or tonsillar abscesses.  Eyes: Conjunctivae are normal.  Cardiovascular: Normal rate and normal heart sounds.   Pulmonary/Chest: Effort normal. No respiratory distress. He has no wheezes. He has no rales.  Abdominal: Soft. There is no tenderness.  Musculoskeletal: Normal range of motion.  Neurological: [name] alert and oriented to person, place, and time.  Skin: Skin is warm and dry. No rash noted.  Psychiatric: He has a normal mood and affect.    ED Course  Procedures (including critical care time) Labs Review Labs Reviewed  RAPID STREP SCREEN (NOT AT [facility]) - Abnormal; Notable for the following:  Streptococcus, Group A Screen (Direct) POSITIVE (*)    All other components within normal limits  COMPREHENSIVE METABOLIC PANEL - Abnormal; Notable for the following:    Potassium 3.2 (*)    Calcium 8.6 (*)    All other components within normal limits  CBC - Abnormal; Notable for the  following:    WBC 15.1 (*)    All other components within normal limits  LIPASE, BLOOD  URINALYSIS, ROUTINE W REFLEX MICROSCOPIC (NOT AT [facility])    Imaging Review No results found. I have personally reviewed and evaluated these images and lab results as part of my medical decision-making.   EKG Interpretation None      MDM   Final diagnoses:  Strep throat    Pt was given IV fluids while here alongiwth zofran, motrin, felt improved while here. Strep +, pt preferred bicillin which was given. He was prescribed phenergan for prn use, ibuprofen, advised rest, increased fluid intake, recheck here for any worsened sx including pain, throat swelling or weakness.  Pt ambulated from dept. Denied weakness , stating felt improved.   Burgess Amor, PA-C 02/21/16 1329  Vanetta Mulders, MD 02/22/16 [phone]

## 2017-01-20 ENCOUNTER — Encounter (HOSPITAL_COMMUNITY): Payer: Self-pay | Admitting: Emergency Medicine

## 2017-01-20 ENCOUNTER — Emergency Department (HOSPITAL_COMMUNITY)
Admission: EM | Admit: 2017-01-20 | Discharge: 2017-01-20 | Disposition: A | Discharge status: Home / Self Care | Payer: Self-pay | Attending: Emergency Medicine | Admitting: Emergency Medicine

## 2017-01-20 ENCOUNTER — Emergency Department (HOSPITAL_COMMUNITY): Payer: Self-pay

## 2017-01-20 DIAGNOSIS — R05 Cough: Secondary | ICD-10-CM | POA: Insufficient documentation

## 2017-01-20 DIAGNOSIS — Z79899 Other long term (current) drug therapy: Secondary | ICD-10-CM | POA: Insufficient documentation

## 2017-01-20 DIAGNOSIS — R6889 Other general symptoms and signs: Secondary | ICD-10-CM

## 2017-01-20 DIAGNOSIS — R509 Fever, unspecified: Secondary | ICD-10-CM | POA: Insufficient documentation

## 2017-01-20 MED ORDER — NAPROXEN 500 MG PO TABS: 500.0000 mg | ORAL_TABLET | Freq: Three times a day (TID) | ORAL | 0 refills | Status: DC

## 2017-01-20 MED ORDER — OXYMETAZOLINE HCL 0.05 % NA SOLN
1.0000 | Freq: Once | NASAL | Status: AC
  Administered 2017-01-20: 1 via NASAL
  Filled 2017-01-20: qty 15

## 2017-01-20 MED ORDER — BENZONATATE 100 MG PO CAPS
100.0000 mg | ORAL_CAPSULE | Freq: Once | ORAL | Status: AC
  Administered 2017-01-20: 100 mg via ORAL
  Filled 2017-01-20: qty 1

## 2017-01-20 MED ORDER — NAPROXEN 250 MG PO TABS
500.0000 mg | ORAL_TABLET | Freq: Once | ORAL | Status: AC
  Administered 2017-01-20: 500 mg via ORAL
  Filled 2017-01-20: qty 2

## 2017-01-20 MED ORDER — BENZONATATE 100 MG PO CAPS: 100.0000 mg | ORAL_CAPSULE | Freq: Three times a day (TID) | ORAL | 0 refills | Status: DC | PRN

## 2017-01-20 NOTE — ED Notes (Signed)
Patient transported to X-ray 

## 2017-01-20 NOTE — ED Triage Notes (Signed)
Pt c/o flu like symptoms, fever, cough, chills body aches, chest congrestion. Pt c/o 7/10 body aches. Temp of 101 fever at home. Pt in NAD

## 2017-01-20 NOTE — ED Provider Notes (Signed)
[facility]-EMERGENCY DEPT Provider Note  CSN: [id number] Arrival date & time: 01/20/17  1739  History   Chief Complaint Chief Complaint  Patient presents with  . Influenza   HPI Wayne L Tonna Cornericholson Jr. is a 21 y.o. male.  The history is provided by the patient and medical records. No language interpreter was used.  Illness  This is a new problem. The current episode started more than 1 week ago. The problem occurs constantly. The problem has not changed since onset.Associated symptoms include headaches. Pertinent negatives include no chest pain, no abdominal pain and no shortness of breath. Nothing aggravates the symptoms.   History reviewed. No pertinent past medical history.  There are no active problems to display for this patient.  History reviewed. No pertinent surgical history.  Home Medications    Prior to Admission medications   Medication Sig Start Date End Date Taking? Authorizing Provider  benzonatate (TESSALON PERLES) 100 MG capsule Take 1 capsule (100 mg total) by mouth 3 (three) times daily as needed for cough. 01/20/17   Angelina Okyan Cortney Beissel, MD  ibuprofen (ADVIL,MOTRIN) 800 MG tablet Take 1 tablet (800 mg total) by mouth every 8 (eight) hours as needed for fever or moderate pain. 02/19/16   Burgess AmorJulie Idol, PA-C  naproxen (NAPROSYN) 500 MG tablet Take 1 tablet (500 mg total) by mouth 3 (three) times daily with meals. 01/20/17   Angelina Okyan Chaske Paskett, MD  promethazine (PHENERGAN) 25 MG tablet Take 1 tablet (25 mg total) by mouth every 6 (six) hours as needed for nausea or vomiting. 02/19/16   Burgess AmorJulie Idol, PA-C   Family History No family history on file.  Social History Social History  Substance Use Topics  . Smoking status: Never Smoker  . Smokeless tobacco: Not on file  . Alcohol use No   Allergies   Patient has no known allergies.  Review of Systems Review of Systems  Constitutional: Positive for appetite change (decreased), chills, fatigue and fever (subjective).  HENT: Positive  for congestion.   Respiratory: Positive for cough. Negative for shortness of breath.   Cardiovascular: Negative for chest pain.  Gastrointestinal: Positive for nausea. Negative for abdominal pain and diarrhea.  Musculoskeletal: Positive for myalgias.  Neurological: Positive for headaches.  All other systems reviewed and are negative.  Physical Exam Updated Vital Signs BP 127/70 (BP Location: Right Arm)   Pulse 91   Temp 99.1 F (37.3 C) (Oral)   Resp 18   SpO2 100%   Physical Exam  Constitutional: [name] oriented to person, place, and time. He appears well-developed and well-nourished. No distress.  HENT:  Head: Normocephalic and atraumatic.  Mild nasal mucosal edema bilaterally, mild erythema of posterior oropharynx without significant edema or exudates  Eyes: EOM are normal. Pupils are equal, round, and reactive to light.  Neck: Normal range of motion. Neck supple.  Cardiovascular: Normal rate, regular rhythm and normal heart sounds.   Pulmonary/Chest: Effort normal and breath sounds normal.  Abdominal: Soft. Bowel sounds are normal. He exhibits no distension. There is no tenderness.  Musculoskeletal: Normal range of motion.  Neurological: [name] alert and oriented to person, place, and time.  Skin: Skin is warm and dry. Capillary refill takes less than 2 seconds. [name] not diaphoretic.  Nursing note and vitals reviewed.  ED Treatments / Results  Labs (all labs ordered are listed, but only abnormal results are displayed) Labs Reviewed - No data to display  EKG  EKG Interpretation None      Radiology Dg Chest  2 View  Result Date: 01/20/2017 CLINICAL DATA:  Productive cough, shortness of breath, chest pain. EXAM: CHEST  2 VIEW COMPARISON:  Radiographs of February 27, 2015. FINDINGS: The heart size and mediastinal contours are within normal limits. Both lungs are clear. No pneumothorax or pleural effusion is noted. The visualized skeletal structures are unremarkable.  IMPRESSION: No active cardiopulmonary disease. Electronically Signed   By: Lupita Raider, M.D.   On: 01/20/2017 19:23   Procedures Procedures (including critical care time)  Medications Ordered in ED Medications  oxymetazoline (AFRIN) 0.05 % nasal spray 1 spray (1 spray Each Nare Given 01/20/17 1928)  naproxen (NAPROSYN) tablet 500 mg (500 mg Oral Given 01/20/17 1927)  benzonatate (TESSALON) capsule 100 mg (100 mg Oral Given 01/20/17 1928)    Initial Impression / Assessment and Plan / ED Course  I have reviewed the triage vital signs and the nursing notes.  21 y.o. male with above stated PMHx, HPI, and physical. Flulike symptoms 3 weeks including subjective fever, chills, headache, body aches, nausea, decreased appetite, cough. Patient denies recent travel or antibiotics or hospitalizations. Patient has no medical problems does not take any medication or basis.  Patient well-appearing on exam. Chest x-ray showing no evidence of acute cardiopulmonary disease, specifically no evidence of pneumonia. Patient given naproxen, Tessalon Perles, and Afrin. Will forego flu testing at this time given no change in medical management.  Laboratory and imaging results were personally reviewed by myself and used in the medical decision making of this patient's treatment and disposition.  Pt discharged home in stable condition. Strict ED return precautions dicussed. Pt understands and agrees with the plan and has no further questions or concerns.   Pt care discussed with and followed by my attending, Dr. Gweneth Fritter, MD Pager [phone]  Final Clinical Impressions(s) / ED Diagnoses   Final diagnoses:  Flu-like symptoms   New Prescriptions New Prescriptions   BENZONATATE (TESSALON PERLES) 100 MG CAPSULE    Take 1 capsule (100 mg total) by mouth 3 (three) times daily as needed for cough.   NAPROXEN (NAPROSYN) 500 MG TABLET    Take 1 tablet (500 mg total) by mouth 3 (three) times daily  with meals.     Angelina Ok, MD 01/20/17 1956    Jacalyn Lefevre, MD 01/20/17 2112

## 2017-02-22 ENCOUNTER — Encounter (HOSPITAL_COMMUNITY): Payer: Self-pay | Admitting: Emergency Medicine

## 2017-02-22 ENCOUNTER — Emergency Department (HOSPITAL_COMMUNITY)
Admission: EM | Admit: 2017-02-22 | Discharge: 2017-02-22 | Disposition: A | Discharge status: Home Health | Payer: Self-pay | Attending: Emergency Medicine | Admitting: Emergency Medicine

## 2017-02-22 DIAGNOSIS — K644 Residual hemorrhoidal skin tags: Secondary | ICD-10-CM | POA: Insufficient documentation

## 2017-02-22 MED ORDER — HYDROCORTISONE 2.5 % RE CREA: TOPICAL_CREAM | RECTAL | 0 refills | Status: DC

## 2017-02-22 MED ORDER — DOCUSATE SODIUM 250 MG PO CAPS: 250.0000 mg | ORAL_CAPSULE | Freq: Every day | ORAL | 0 refills | Status: DC

## 2017-02-22 NOTE — ED Provider Notes (Signed)
[facility]-EMERGENCY DEPT Provider Note   CSN: [id number] Arrival date & time: 02/22/17  4098  By signing my name below, I, Cynda Acres, attest that this documentation has been prepared under the direction and in the presence of Keldrick Pomplun PA-C. Electronically Signed: Cynda Acres, Scribe. 02/22/17. 11:34 AM.  History   Chief Complaint Chief Complaint  Patient presents with  . Hemorrhoids    HPI Comments: Wayne Reed. is a 21 y.o. male with no pertinent medical history, who presents to the Emergency Department complaining of sudden-onset pain to the rectal area that began 4 days ago. Patient reports a history of hemorrhoids and this pain feels similar. Patient reports associated intermittent bleeding after defecation, swelling of his rectal area,  Patient reports using preparation H, witch hazel, warm compresses, and stool softeners without relief. Patient describes the severity of his pain as a 10/10.  Patient denies any fever, vomiting, trauma, and abdominal pain.   The history is provided by the patient. No language interpreter was used.    History reviewed. No pertinent past medical history.  There are no active problems to display for this patient.   History reviewed. No pertinent surgical history.     Home Medications    Prior to Admission medications   Medication Sig Start Date End Date Taking? Authorizing Provider  benzonatate (TESSALON PERLES) 100 MG capsule Take 1 capsule (100 mg total) by mouth 3 (three) times daily as needed for cough. 01/20/17   Angelina Ok, MD  ibuprofen (ADVIL,MOTRIN) 800 MG tablet Take 1 tablet (800 mg total) by mouth every 8 (eight) hours as needed for fever or moderate pain. 02/19/16   Burgess Amor, PA-C  naproxen (NAPROSYN) 500 MG tablet Take 1 tablet (500 mg total) by mouth 3 (three) times daily with meals. 01/20/17   Angelina Ok, MD  promethazine (PHENERGAN) 25 MG tablet Take 1 tablet (25 mg total) by mouth every 6 (six) hours as  needed for nausea or vomiting. 02/19/16   Burgess Amor, PA-C    Family History History reviewed. No pertinent family history.  Social History Social History  Substance Use Topics  . Smoking status: Never Smoker  . Smokeless tobacco: Not on file  . Alcohol use No     Allergies   Patient has no known allergies.   Review of Systems Review of Systems  Constitutional: Negative for appetite change, chills and fever.  Gastrointestinal: Positive for blood in stool and constipation. Negative for abdominal pain, nausea and vomiting.  Genitourinary: Negative for dysuria and flank pain.  Neurological: Negative for weakness and numbness.     Physical Exam Updated Vital Signs BP 117/80 (BP Location: Right Arm)   Pulse 104   Temp 98.8 F (37.1 C) (Oral)   Resp 18   Ht 6\' 5"  (1.956 m)   Wt 230 lb (104.3 kg)   SpO2 100%   BMI 27.27 kg/m   Physical Exam  Constitutional: [name] oriented to person, place, and time. He appears well-developed and well-nourished. No distress.  HENT:  Head: Normocephalic and atraumatic.  Mouth/Throat: Oropharynx is clear and moist.  Cardiovascular: Normal rate and regular rhythm.   Pulmonary/Chest: Effort normal.  Abdominal: Soft. Bowel sounds are normal. He exhibits no distension and no mass. There is no tenderness. There is no guarding.  Genitourinary:  Genitourinary Comments: 1 cm external hemorrhoid present without thrombosis. Mild tenderness on exam. No bleeding.  Exam was chaperoned  Musculoskeletal: Normal range of motion.  Neurological: [name] alert and  oriented to person, place, and time.  Skin: Skin is warm and dry. No rash noted.  Psychiatric: He has a normal mood and affect.  Nursing note and vitals reviewed.    ED Treatments / Results  DIAGNOSTIC STUDIES: Oxygen Saturation is 100% on RA, normal by my interpretation.    COORDINATION OF CARE: 11:34 AM Discussed treatment plan with pt at bedside and pt agreed to plan, which includes a  Anusol HC and a general surgery referral.   Labs (all labs ordered are listed, but only abnormal results are displayed) Labs Reviewed - No data to display  EKG  EKG Interpretation None       Radiology No results found.  Procedures Procedures (including critical care time)  Medications Ordered in ED Medications - No data to display   Initial Impression / Assessment and Plan / ED Course  I have reviewed the triage vital signs and the nursing notes.  Pertinent labs & imaging results that were available during my care of the patient were reviewed by me and considered in my medical decision making (see chart for details).     Well appearing pt with hx of recurrent external hemorrhoids.  No thrombosis.   Pt agrees to stool softener, increased fiber and water intake.  rx for anusol HC.  Referral given for general surgery.  Return precautions discussed.  Pt appears stable for d/c  Final Clinical Impressions(s) / ED Diagnoses   Final diagnoses:  External hemorrhoid    New Prescriptions New Prescriptions   No medications on file   I personally performed the services described in this documentation, which was scribed in my presence. The recorded information has been reviewed and is accurate.     Rosey Bathammy Caelynn Marshman, PA-C 02/24/17 2138    Eber HongBrian Miller, MD 02/27/17 1056

## 2017-02-22 NOTE — Discharge Instructions (Signed)
Its important to drink plenty of water and eat extra fiber to help keep your stools soft.  Use the cream as directed.  Warm wet soaks or compresses on/off to your leg.  Return here for any worsening symtpoms

## 2017-02-22 NOTE — ED Triage Notes (Signed)
Pt reports hemorrhoids x3 days with bright red bleeding.

## 2017-07-26 ENCOUNTER — Emergency Department (HOSPITAL_COMMUNITY)
Admission: EM | Admit: 2017-07-26 | Discharge: 2017-07-26 | Disposition: A | Discharge status: Home / Self Care | Payer: Self-pay | Attending: Emergency Medicine | Admitting: Emergency Medicine

## 2017-07-26 ENCOUNTER — Encounter (HOSPITAL_COMMUNITY): Payer: Self-pay | Admitting: *Deleted

## 2017-07-26 DIAGNOSIS — R197 Diarrhea, unspecified: Secondary | ICD-10-CM | POA: Insufficient documentation

## 2017-07-26 DIAGNOSIS — R112 Nausea with vomiting, unspecified: Secondary | ICD-10-CM | POA: Insufficient documentation

## 2017-07-26 LAB — COMPREHENSIVE METABOLIC PANEL
ALBUMIN: 4.3 g/dL (ref 3.5–5.0)
ALK PHOS: 65 U/L (ref 38–126)
ALT: 21 U/L (ref 17–63)
AST: 24 U/L (ref 15–41)
Anion gap: 7 (ref 5–15)
BILIRUBIN TOTAL: 0.4 mg/dL (ref 0.3–1.2)
BUN: 12 mg/dL (ref 6–20)
CALCIUM: 9.5 mg/dL (ref 8.9–10.3)
CO2: 26 mmol/L (ref 22–32)
Chloride: 106 mmol/L (ref 101–111)
Creatinine, Ser: 0.8 mg/dL (ref 0.61–1.24)
GFR calc Af Amer: >60 mL/min (ref >60)
GLUCOSE: 82 mg/dL (ref 65–99)
Potassium: 3.8 mmol/L (ref 3.5–5.1)
Sodium: 139 mmol/L (ref 135–145)
TOTAL PROTEIN: 7.8 g/dL (ref 6.5–8.1)

## 2017-07-26 LAB — POC OCCULT BLOOD, ED: FECAL OCCULT BLD: POSITIVE — AB

## 2017-07-26 LAB — CBC
HCT: 42.2 % (ref 39.0–52.0)
Hemoglobin: 14.2 g/dL (ref 13.0–17.0)
MCH: 28.6 pg (ref 26.0–34.0)
MCHC: 33.6 g/dL (ref 30.0–36.0)
MCV: 84.9 fL (ref 78.0–100.0)
PLATELETS: 251 10*3/uL (ref 150–400)
RBC: 4.97 MIL/uL (ref 4.22–5.81)
RDW: 14 % (ref 11.5–15.5)
WBC: 9.5 10*3/uL (ref 4.0–10.5)

## 2017-07-26 LAB — LIPASE, BLOOD: Lipase: 29 U/L (ref 11–51)

## 2017-07-26 MED ORDER — ONDANSETRON HCL 4 MG/2ML IJ SOLN
4.0000 mg | Freq: Once | INTRAMUSCULAR | Status: AC
  Administered 2017-07-26: 4 mg via INTRAVENOUS
  Filled 2017-07-26: qty 2

## 2017-07-26 MED ORDER — SODIUM CHLORIDE 0.9 % IV BOLUS (SEPSIS)
1000.0000 mL | Freq: Once | INTRAVENOUS | Status: AC
  Administered 2017-07-26: 1000 mL via INTRAVENOUS

## 2017-07-26 MED ORDER — ONDANSETRON 8 MG PO TBDP: 8.0000 mg | ORAL_TABLET | Freq: Three times a day (TID) | ORAL | 0 refills | Status: DC | PRN

## 2017-07-26 MED ORDER — PANTOPRAZOLE SODIUM 20 MG PO TBEC: 20.0000 mg | DELAYED_RELEASE_TABLET | Freq: Every day | ORAL | 0 refills | Status: DC

## 2017-07-26 MED ORDER — PANTOPRAZOLE SODIUM 40 MG IV SOLR
40.0000 mg | Freq: Once | INTRAVENOUS | Status: AC
  Administered 2017-07-26: 40 mg via INTRAVENOUS
  Filled 2017-07-26: qty 40

## 2017-07-26 NOTE — Discharge Instructions (Signed)
Oral hydration with gatorade mixed half and half with water- avoid red Recheck with your doctor this week.

## 2017-07-26 NOTE — ED Triage Notes (Addendum)
Pt c/o black stools, diarrhea, nausea, abdominal bloating and chills x 1 week. Pt reports he took Pepto-Bismol yesterday, but the black stool started before it. Denies vomiting.

## 2017-07-26 NOTE — ED Provider Notes (Signed)
[facility] Provider Note   CSN: [id number] Arrival date & time: 07/26/17  1332     History   Chief Complaint Chief Complaint  Patient presents with  . Diarrhea    black stools x 7 days  . Abdominal Pain    HPI Wayne L Tonna Cornericholson Jr. is a 21 y.o. male.  HPI 21 y.o male with complaints of n/v/d for a week.  Some fever earlier this week but this has resolved.  Vomitng is better, but continues with multiple loose stools daily.  He has noted some dark stools.  He has been eating and drinking but not as much as usual.   History reviewed. No pertinent past medical history.  There are no active problems to display for this patient.   History reviewed. No pertinent surgical history.     Home Medications    Prior to Admission medications   Medication Sig Start Date End Date Taking? Authorizing Provider  bismuth subsalicylate (PEPTO BISMOL) 262 MG/15ML suspension Take 30 mLs by mouth every 6 (six) hours as needed for indigestion or diarrhea or loose stools.   Yes [provider]  Bismuth Subsalicylate (PEPTO-BISMOL) 262 MG TABS Take 2 each by mouth once.   Yes [provider]    Family History No family history on file.  Social History Social History  Substance Use Topics  . Smoking status: Never Smoker  . Smokeless tobacco: Never Used  . Alcohol use No     Allergies   Patient has no known allergies.   Review of Systems Review of Systems  All other systems reviewed and are negative.    Physical Exam Updated Vital Signs BP 130/67 (BP Location: Right Arm)   Pulse 83   Temp 98.2 F (36.8 C) (Oral)   Resp 18   Ht 1.956 m (6\' 5" )   Wt 116.2 kg (256 lb 3.2 oz)   SpO2 99%   BMI 30.38 kg/m   Physical Exam  Constitutional: [name] oriented to person, place, and time. He appears well-developed and well-nourished.  HENT:  Head: Normocephalic and atraumatic.  Right Ear: External ear normal.  Left Ear: External ear normal.  Eyes: Pupils  are equal, round, and reactive to light. EOM are normal.  Neck: Normal range of motion. Neck supple.  Cardiovascular: Normal rate, regular rhythm and normal heart sounds.   Pulmonary/Chest: Effort normal and breath sounds normal.  Abdominal: Soft. Bowel sounds are normal.  Genitourinary:  Genitourinary Comments: Rectal exam without masses or gross blood  Musculoskeletal: Normal range of motion.  Neurological: [name] alert and oriented to person, place, and time.  Skin: Skin is warm and dry. Capillary refill takes less than 2 seconds.  Nursing note and vitals reviewed.    ED Treatments / Results  Labs (all labs ordered are listed, but only abnormal results are displayed) Labs Reviewed  POC OCCULT BLOOD, ED - Abnormal; Notable for the following:       Result Value   Fecal Occult Bld POSITIVE (*)    All other components within normal limits  LIPASE, BLOOD  COMPREHENSIVE METABOLIC PANEL  CBC  URINALYSIS, ROUTINE W REFLEX MICROSCOPIC  OCCULT BLOOD X 1 CARD TO LAB, STOOL    EKG  EKG Interpretation None       Radiology No results found.  Procedures Procedures (including critical care time)  Medications Ordered in ED Medications  sodium chloride 0.9 % bolus 1,000 mL (1,000 mLs Intravenous New Bag/Given 07/26/17 1506)  ondansetron (ZOFRAN) injection  4 mg (4 mg Intravenous Given 07/26/17 1505)  pantoprazole (PROTONIX) injection 40 mg (40 mg Intravenous Given 07/26/17 1506)     Initial Impression / Assessment and Plan / ED Course  I have reviewed the triage vital signs and the nursing notes.  Pertinent labs & imaging results that were available during my care of the patient were reviewed by me and considered in my medical decision making (see chart for details).    Patient hemodynamically stable and hydrated here.  Plan protonix zofran and f/u as needed.    Final Clinical Impressions(s) / ED Diagnoses   Final diagnoses:  Nausea vomiting and diarrhea    New  Prescriptions New Prescriptions   ONDANSETRON (ZOFRAN ODT) 8 MG DISINTEGRATING TABLET    Take 1 tablet (8 mg total) by mouth every 8 (eight) hours as needed for nausea or vomiting.   PANTOPRAZOLE (PROTONIX) 20 MG TABLET    Take 1 tablet (20 mg total) by mouth daily.     Margarita Grizzleay, Jailynne Opperman, MD 07/26/17 [phone]

## 2017-08-05 ENCOUNTER — Encounter (HOSPITAL_COMMUNITY): Payer: Self-pay | Admitting: *Deleted

## 2017-08-05 ENCOUNTER — Emergency Department (HOSPITAL_COMMUNITY)
Admission: EM | Admit: 2017-08-05 | Discharge: 2017-08-06 | Disposition: A | Discharge status: Home / Self Care | Payer: Self-pay | Attending: Emergency Medicine | Admitting: Emergency Medicine

## 2017-08-05 DIAGNOSIS — K625 Hemorrhage of anus and rectum: Secondary | ICD-10-CM | POA: Insufficient documentation

## 2017-08-05 DIAGNOSIS — K6289 Other specified diseases of anus and rectum: Secondary | ICD-10-CM | POA: Insufficient documentation

## 2017-08-05 DIAGNOSIS — R109 Unspecified abdominal pain: Secondary | ICD-10-CM | POA: Insufficient documentation

## 2017-08-05 NOTE — ED Triage Notes (Signed)
Pt c/o bloody diarrhea x 3 weeks; pt states he was seen here x 3 weeks ago; pt states he is having rectal pain and describes it as a burning sensation

## 2017-08-06 ENCOUNTER — Emergency Department (HOSPITAL_COMMUNITY): Payer: Self-pay

## 2017-08-06 LAB — COMPREHENSIVE METABOLIC PANEL
ALT: 17 U/L (ref 17–63)
ANION GAP: 9 (ref 5–15)
AST: 19 U/L (ref 15–41)
Albumin: 4.3 g/dL (ref 3.5–5.0)
Alkaline Phosphatase: 57 U/L (ref 38–126)
BILIRUBIN TOTAL: 0.4 mg/dL (ref 0.3–1.2)
BUN: 18 mg/dL (ref 6–20)
CO2: 24 mmol/L (ref 22–32)
Calcium: 9.3 mg/dL (ref 8.9–10.3)
Chloride: 106 mmol/L (ref 101–111)
Creatinine, Ser: 0.83 mg/dL (ref 0.61–1.24)
Glucose, Bld: 94 mg/dL (ref 65–99)
POTASSIUM: 4 mmol/L (ref 3.5–5.1)
Sodium: 139 mmol/L (ref 135–145)
TOTAL PROTEIN: 7.8 g/dL (ref 6.5–8.1)

## 2017-08-06 LAB — CBC
HEMATOCRIT: 40.9 % (ref 39.0–52.0)
Hemoglobin: 13.6 g/dL (ref 13.0–17.0)
MCH: 27.9 pg (ref 26.0–34.0)
MCHC: 33.3 g/dL (ref 30.0–36.0)
MCV: 83.8 fL (ref 78.0–100.0)
Platelets: 332 10*3/uL (ref 150–400)
RBC: 4.88 MIL/uL (ref 4.22–5.81)
RDW: 14 % (ref 11.5–15.5)
WBC: 9.8 10*3/uL (ref 4.0–10.5)

## 2017-08-06 LAB — LIPASE, BLOOD: Lipase: 51 U/L (ref 11–51)

## 2017-08-06 LAB — POC OCCULT BLOOD, ED: Fecal Occult Bld: POSITIVE — AB

## 2017-08-06 MED ORDER — HYDROCORTISONE ACETATE 25 MG RE SUPP: 25.0000 mg | Freq: Two times a day (BID) | RECTAL | 0 refills | Status: DC

## 2017-08-06 MED ORDER — ONDANSETRON HCL 4 MG/2ML IJ SOLN
INTRAMUSCULAR | Status: AC
  Filled 2017-08-06: qty 2

## 2017-08-06 MED ORDER — MORPHINE SULFATE (PF) 2 MG/ML IV SOLN
INTRAVENOUS | Status: AC
  Filled 2017-08-06: qty 2

## 2017-08-06 MED ORDER — IOPAMIDOL (ISOVUE-300) INJECTION 61%
INTRAVENOUS | Status: AC
  Administered 2017-08-06: 30 mL
  Filled 2017-08-06: qty 30

## 2017-08-06 MED ORDER — ONDANSETRON HCL 4 MG/2ML IJ SOLN
4.0000 mg | Freq: Once | INTRAMUSCULAR | Status: AC
  Administered 2017-08-06: 4 mg via INTRAVENOUS

## 2017-08-06 MED ORDER — MORPHINE SULFATE (PF) 4 MG/ML IV SOLN
4.0000 mg | Freq: Once | INTRAVENOUS | Status: AC
  Administered 2017-08-06: 4 mg via INTRAVENOUS

## 2017-08-06 MED ORDER — CIPROFLOXACIN HCL 250 MG PO TABS
500.0000 mg | ORAL_TABLET | Freq: Once | ORAL | Status: AC
  Administered 2017-08-06: 500 mg via ORAL
  Filled 2017-08-06: qty 2

## 2017-08-06 MED ORDER — METRONIDAZOLE 500 MG PO TABS: 500.0000 mg | ORAL_TABLET | Freq: Three times a day (TID) | ORAL | 0 refills | Status: DC

## 2017-08-06 MED ORDER — METRONIDAZOLE 500 MG PO TABS
500.0000 mg | ORAL_TABLET | Freq: Once | ORAL | Status: AC
  Administered 2017-08-06: 500 mg via ORAL
  Filled 2017-08-06: qty 1

## 2017-08-06 MED ORDER — IOPAMIDOL (ISOVUE-300) INJECTION 61%
100.0000 mL | Freq: Once | INTRAVENOUS | Status: AC | PRN
  Administered 2017-08-06: 100 mL via INTRAVENOUS

## 2017-08-06 MED ORDER — CIPROFLOXACIN HCL 500 MG PO TABS: 500.0000 mg | ORAL_TABLET | Freq: Two times a day (BID) | ORAL | 0 refills | Status: DC

## 2017-08-06 NOTE — ED Provider Notes (Signed)
4:20 AM patient was left with me at the change of shift to get the results of his CT scan. Patient states he's been having left-sided abdominal pain for the past 2 weeks. He states when he wipes he sees a lot of blood and he also sees some blood mixed with his stool.  On my exam patient is tender diffusely in his left abdomen. He has very minimal discomfort on the right lower quadrant and nowhere else in the abdomen.  05:56 AM Pt was discussed with Dr Conley RollsLe, hospitalist, will treat as outpatient with anusol HC, cipro and flagyl and refer to GI.   Ct Abdomen Pelvis W Contrast  Result Date: 08/06/2017 CLINICAL DATA:  Abdominal pain with bloody diarrhea EXAM: CT ABDOMEN AND PELVIS WITH CONTRAST TECHNIQUE: Multidetector CT imaging of the abdomen and pelvis was performed using the standard protocol following bolus administration of intravenous contrast. CONTRAST:  100 mL Isovue-300 intravenous COMPARISON:  09/23/2013 FINDINGS: Lower chest: No acute abnormality. Hepatobiliary: No focal liver abnormality is seen. No gallstones, gallbladder wall thickening, or biliary dilatation. Pancreas: Unremarkable. No pancreatic ductal dilatation or surrounding inflammatory changes. Spleen: Normal in size without focal abnormality. Adrenals/Urinary Tract: Adrenal glands are unremarkable. Kidneys are normal, without renal calculi, focal lesion, or hydronephrosis. Bladder is unremarkable. Stomach/Bowel: Stomach nonenlarged. No dilated small bowel. Slight enlargement of the appendix at 9 mm, not much surrounding inflammation. Questionable mild rectal wall thickening. Vascular/Lymphatic: Prominent right lower quadrant lymph nodes, measuring up to 12 mm. Non aneurysmal aorta. Reproductive: Prostate is unremarkable. Other: Negative for free air or free fluid Musculoskeletal: No acute or suspicious bone lesion IMPRESSION: 1. Slightly enlarged appendix up to 9 mm but not much surrounding inflammation, clinical correlation is recommended.  There are prominent right lower quadrant mesenteric lymph nodes nonspecific but could be seen with adenitis 2. Questionable mild rectal wall thickening, query proctitis. No other significant bowel wall thickening is evident Electronically Signed   By: Jasmine PangKim  Fujinaga M.D.   On: 08/06/2017 03:33    New Prescriptions   CIPROFLOXACIN (CIPRO) 500 MG TABLET    Take 1 tablet (500 mg total) by mouth 2 (two) times daily.   HYDROCORTISONE (ANUSOL-HC) 25 MG SUPPOSITORY    Place 1 suppository (25 mg total) rectally 2 (two) times daily.   METRONIDAZOLE (FLAGYL) 500 MG TABLET    Take 1 tablet (500 mg total) by mouth 3 (three) times daily.    Plan discharge  Devoria AlbeIva Cord Wilczynski, MD, FACEP    Medical screening examination/treatment/procedure(s) were conducted as a shared visit with non-physician practitioner(s) and myself.  I personally evaluated the patient during the encounter.  Devoria AlbeIva English Tomer, MD, Concha PyoFACEP     Earnestine Tuohey, MD 08/06/17 [phone]

## 2017-08-06 NOTE — ED Notes (Signed)
Reminded pt of need for urine sample. Pt states that he used to bathroom 10 minutes ago without letting any nursing staff know, and is unable to provide sample still

## 2017-08-06 NOTE — ED Notes (Signed)
Pt informed of need for urine sample. Pt states he does not have to go at this time but will try as soon as he is able

## 2017-08-06 NOTE — Discharge Instructions (Signed)
Use the rectal suppositories for your rectal pain and bleeding. You have a inflammation of your rectum called proctitis. Take the antibiotics until gone. Call Dr Patty Sermonsehman's office to get an appointment to evaluate you further for your rectal bleeding. [name] a gastrointestinal specialist.   Return to the ED if you have just pure blood coming from your rectum, you get dizzy or weak, get  a fever or seem worse.

## 2017-08-06 NOTE — ED Provider Notes (Signed)
[facility] Provider Note   CSN: [id number] Arrival date & time: 08/05/17  2330     History   Chief Complaint Chief Complaint  Patient presents with  . Rectal Pain    HPI Wayne L Tonna Cornericholson Jr. is a 21 y.o. male.  Patient is a 21 year old male who presents to the emergency department with a complaint of abdominal pain, rectal pain, and blood in stool.  The patient states that he has been having problems with his stomach and rectal area for quite some time. He was last seen in the emergency department on August 5 at which time he was diagnosed with nausea, vomiting, diarrhea. He was placed on anti-emetics and proton pump inhibitors and referred to gastroenterology. The patient states that he has not been to gastroenterology because he did not understand that he was supposed to go to a specialist. He states that from the time he left the emergency department up until now he has been mostly having rectal pain. This is been particularly worse in the last week. He has had some lower abdominal pain. He notices blood when he wipes. He has not had any blood in his underwear. He's not had any operations or procedures involving his abdomen or rectum. His been no injury or trauma to be reported.      History reviewed. No pertinent past medical history.  There are no active problems to display for this patient.   History reviewed. No pertinent surgical history.     Home Medications    Prior to Admission medications   Medication Sig Start Date End Date Taking? Authorizing Provider  bismuth subsalicylate (PEPTO BISMOL) 262 MG/15ML suspension Take 30 mLs by mouth every 6 (six) hours as needed for indigestion or diarrhea or loose stools.    [provider]  Bismuth Subsalicylate (PEPTO-BISMOL) 262 MG TABS Take 2 each by mouth once.    [provider]  ondansetron (ZOFRAN ODT) 8 MG disintegrating tablet Take 1 tablet (8 mg total) by mouth every 8 (eight) hours as  needed for nausea or vomiting. 07/26/17   Margarita Grizzleay, Danielle, MD  pantoprazole (PROTONIX) 20 MG tablet Take 1 tablet (20 mg total) by mouth daily. 07/26/17   Margarita Grizzleay, Danielle, MD    Family History History reviewed. No pertinent family history.  Social History Social History  Substance Use Topics  . Smoking status: Never Smoker  . Smokeless tobacco: Never Used  . Alcohol use No     Allergies   Patient has no known allergies.   Review of Systems Review of Systems  Constitutional: Negative for activity change and fever.       All ROS Neg except as noted in HPI  HENT: Negative for nosebleeds.   Eyes: Negative for photophobia and discharge.  Respiratory: Negative for cough, shortness of breath and wheezing.   Cardiovascular: Negative for chest pain and palpitations.  Gastrointestinal: Positive for abdominal pain, blood in stool and rectal pain.  Genitourinary: Negative for dysuria, frequency and hematuria.  Musculoskeletal: Negative for arthralgias, back pain and neck pain.  Skin: Negative.   Neurological: Negative for dizziness, seizures and speech difficulty.  Psychiatric/Behavioral: Negative for confusion and hallucinations.     Physical Exam Updated Vital Signs BP 138/83   Pulse 74   Temp 98.4 F (36.9 C)   Resp 18   Ht 6\' 5"  (1.956 m)   Wt 116.1 kg (256 lb)   SpO2 100%   BMI 30.36 kg/m   Physical Exam  Constitutional: [name]  oriented to person, place, and time. He appears well-developed and well-nourished.  Non-toxic appearance.  HENT:  Head: Normocephalic.  Right Ear: Tympanic membrane and external ear normal.  Left Ear: Tympanic membrane and external ear normal.  Eyes: Pupils are equal, round, and reactive to light. EOM and lids are normal.  Neck: Normal range of motion. Neck supple. Carotid bruit is not present.  Cardiovascular: Normal rate, regular rhythm, normal heart sounds, intact distal pulses and normal pulses.   Pulmonary/Chest: Breath sounds normal. No  respiratory distress.  Abdominal: Soft. Bowel sounds are normal. There is tenderness in the right lower quadrant and left lower quadrant. There is no rigidity and no guarding.  Genitourinary: Rectal exam shows tenderness and guaiac positive stool. Rectal exam shows no fissure, no mass and anal tone normal.  Genitourinary Comments: No external fistula noted. No abscess noted. No external hkemorrhoid. Extreme tenderness of the rectal vault.  Musculoskeletal: Normal range of motion.  Lymphadenopathy:       Head (right side): No submandibular adenopathy present.       Head (left side): No submandibular adenopathy present.    He has no cervical adenopathy.  Neurological: [name] alert and oriented to person, place, and time. He has normal strength. No cranial nerve deficit or sensory deficit.  Skin: Skin is warm and dry.  Psychiatric: He has a normal mood and affect. His speech is normal.  Nursing note and vitals reviewed.    ED Treatments / Results  Labs (all labs ordered are listed, but only abnormal results are displayed) Labs Reviewed  POC OCCULT BLOOD, ED - Abnormal; Notable for the following:       Result Value   Fecal Occult Bld POSITIVE (*)    All other components within normal limits  LIPASE, BLOOD  COMPREHENSIVE METABOLIC PANEL  CBC  URINALYSIS, ROUTINE W REFLEX MICROSCOPIC    EKG  EKG Interpretation None       Radiology No results found.  Procedures Procedures (including critical care time)  Medications Ordered in ED Medications - No data to display   Initial Impression / Assessment and Plan / ED Course  I have reviewed the triage vital signs and the nursing notes.  Pertinent labs & imaging results that were available during my care of the patient were reviewed by me and considered in my medical decision making (see chart for details).       Final Clinical Impressions(s) / ED Diagnoses MDM Vital signs within normal limits. Stool is positive for occult  blood. There was no blood on my gloved finger. I did not see external hemorrhoids at this time.  Lipase is normal at 51. The competence of metabolic panel is within normal limits. The complete blood count is within normal limits.  The patient will receive a CT abdomen and pelvis exam with contrast to evaluate for signs of colitis, mass, or signs of infection.   Patient's care to be continued by Dr. Lynelle Doctor.   Final diagnoses:  Proctitis  Rectal bleeding    New Prescriptions New Prescriptions   No medications on file     Wayne Reed 08/07/17 1214    Devoria Albe, MD 08/11/17 [phone]

## 2017-08-06 NOTE — ED Notes (Signed)
Pt back from CT

## 2017-08-10 ENCOUNTER — Encounter (INDEPENDENT_AMBULATORY_CARE_PROVIDER_SITE_OTHER): Payer: Self-pay | Admitting: Internal Medicine

## 2017-08-10 ENCOUNTER — Ambulatory Visit (INDEPENDENT_AMBULATORY_CARE_PROVIDER_SITE_OTHER): Payer: Self-pay | Admitting: Internal Medicine

## 2017-11-13 IMAGING — CT CT ABD-PELV W/ CM
2 of 4 series · 16 of 46 positions shown, 18 images · IV contrast (Isovue)
Comparison: 09/23/2013

CLINICAL DATA: Abdominal pain with bloody diarrhea

EXAM:
CT ABDOMEN AND PELVIS WITH CONTRAST
TECHNIQUE: Multidetector CT imaging of the abdomen and pelvis was performed
using the standard protocol following bolus administration of
intravenous contrast.
CONTRAST:  100 mL 5sovue-I88 intravenous

[Series 2: axial st · axial · 0.90mm/px · z∈[+957,+1482]mm · 13 of 116 slices shown, 15 images]
[im 6/116  soft-tissue]
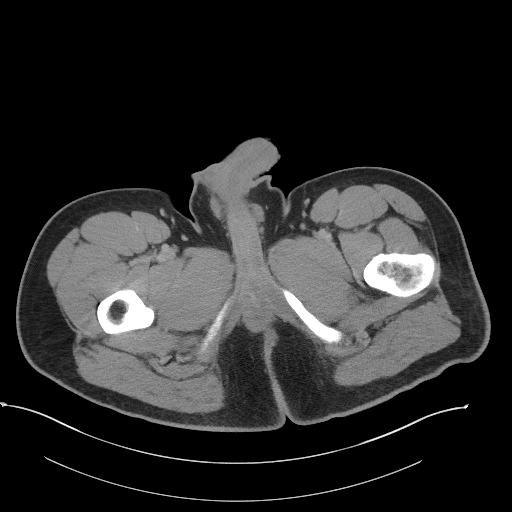
[im 6/116  bone]
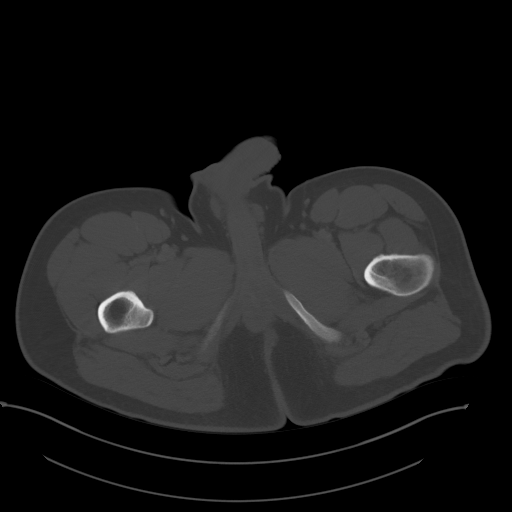
[im 16/116  soft-tissue]
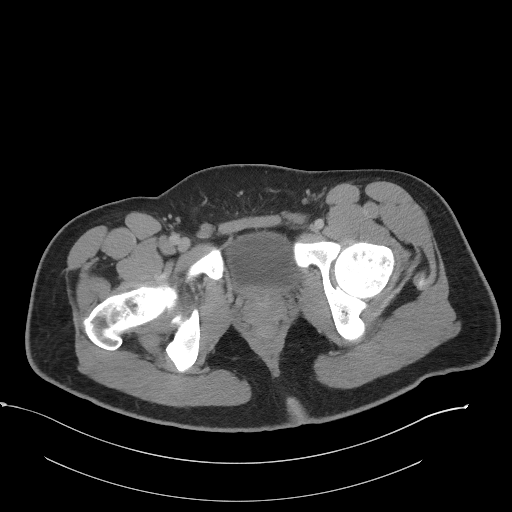
[im 26/116  soft-tissue]
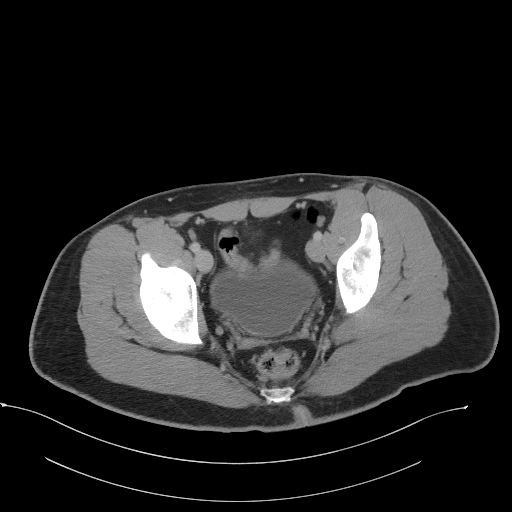
[im 31/116  soft-tissue]
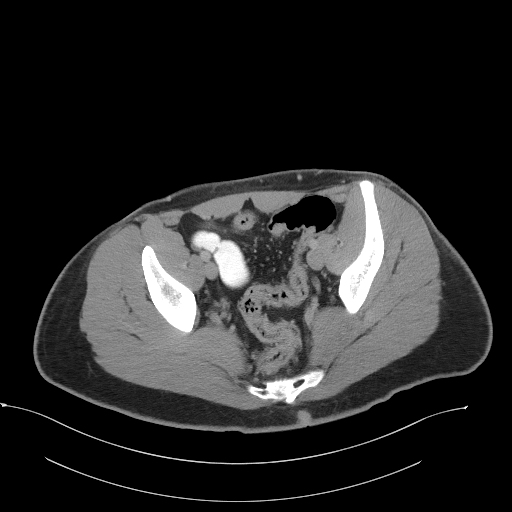
[im 41/116  soft-tissue]
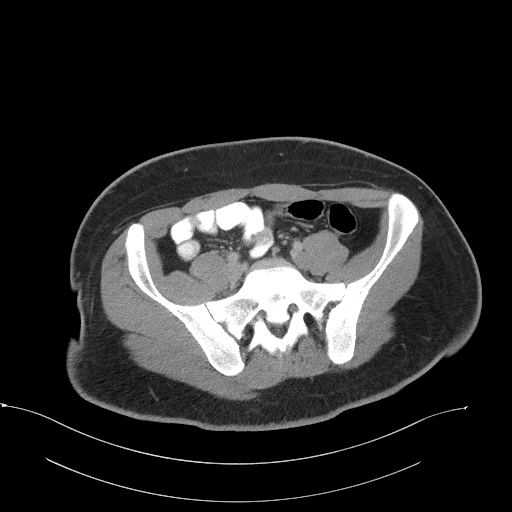
[im 51/116  soft-tissue]
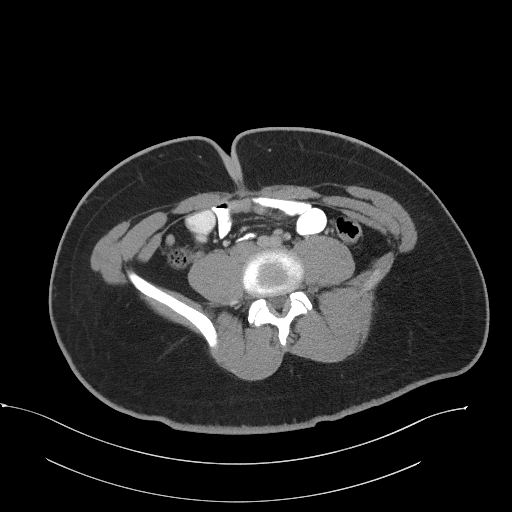
[im 61/116  soft-tissue]
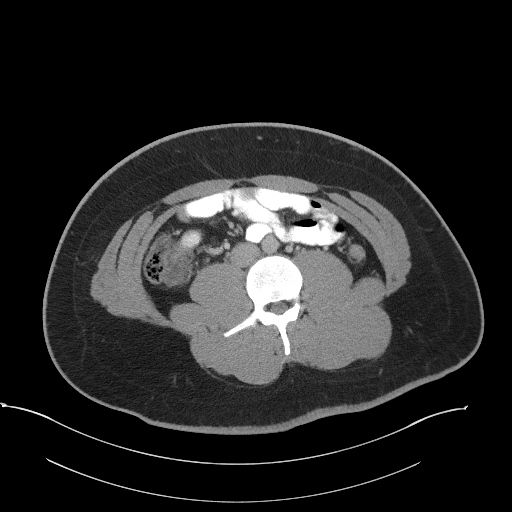
[im 66/116  soft-tissue]
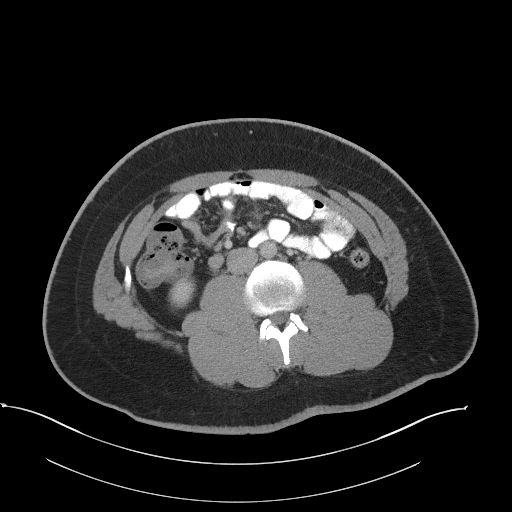
[im 76/116  soft-tissue]
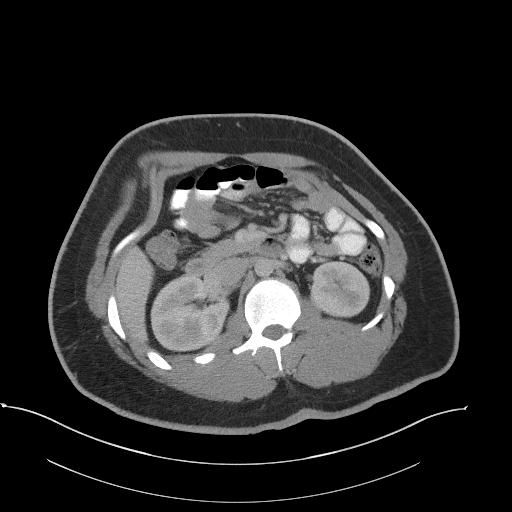
[im 76/116  bone]
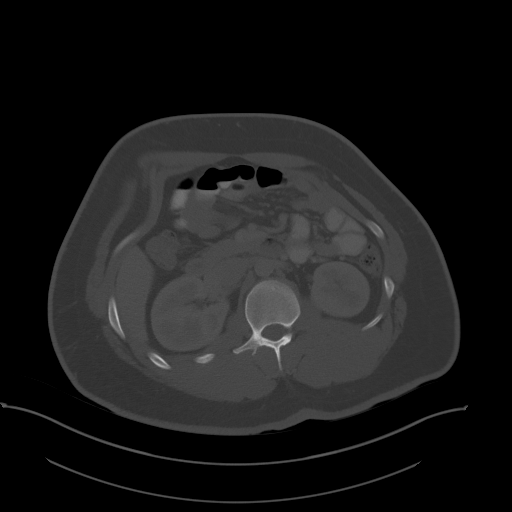
[im 86/116  soft-tissue]
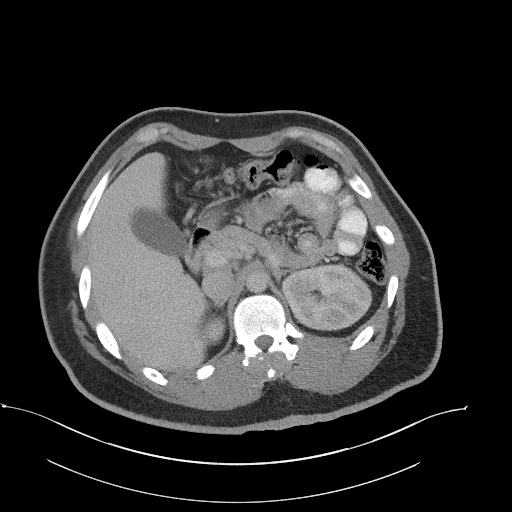
[im 91/116  soft-tissue]
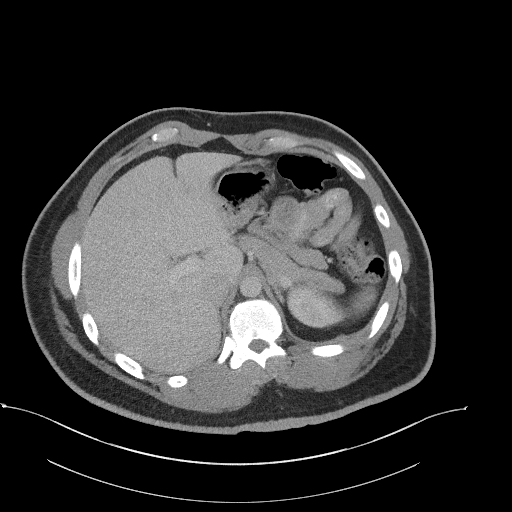
[im 101/116  soft-tissue]
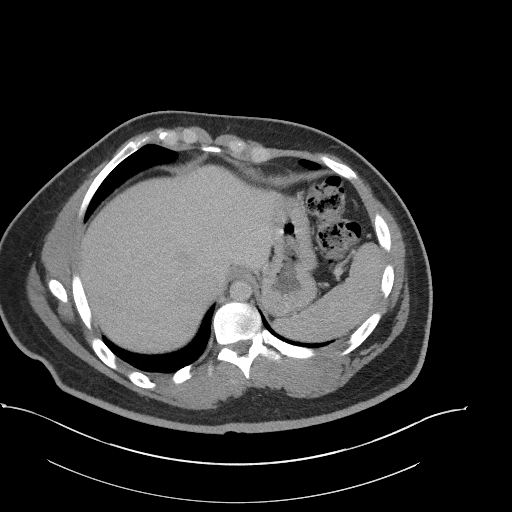
[im 111/116  soft-tissue]
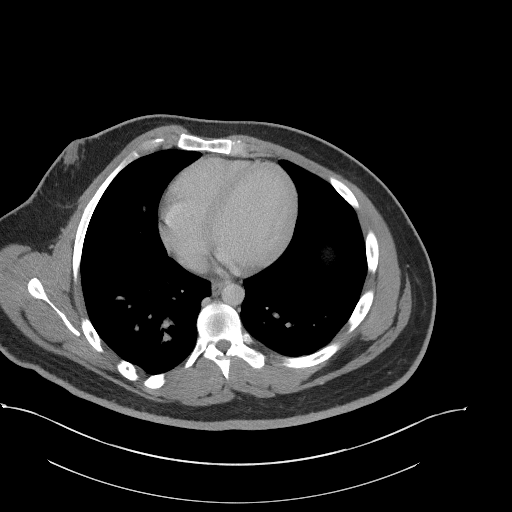

[Series 5: coronal st · coronal · 0.92mm/px · 3 of 99 slices shown]
[im 33/99  soft-tissue]
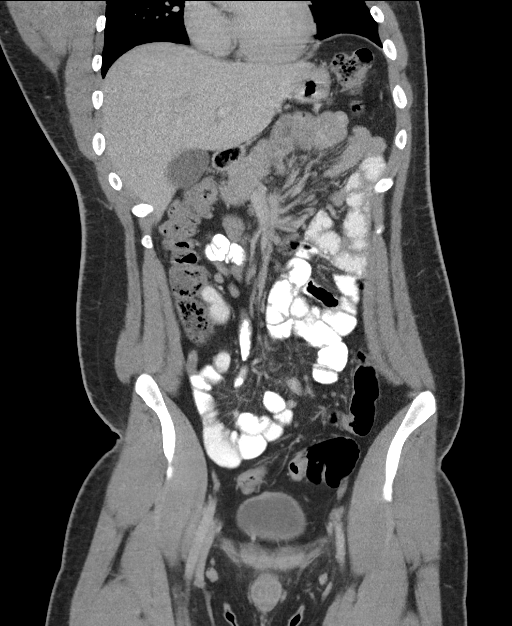
[im 44/99  soft-tissue]
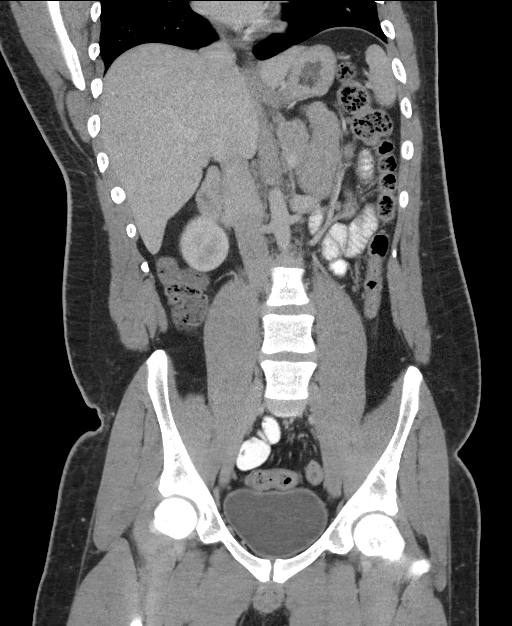
[im 55/99  soft-tissue]
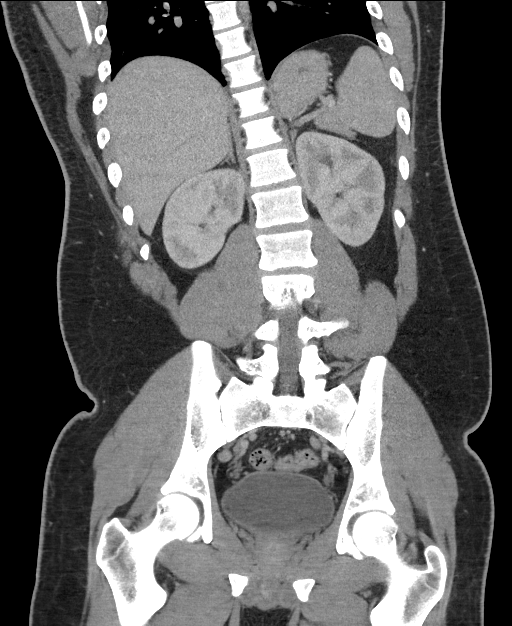

[16 of 46 positions shown; findings below may reference images not displayed]

FINDINGS: Lower chest: No acute abnormality.

Hepatobiliary: No focal liver abnormality is seen. No gallstones,
gallbladder wall thickening, or biliary dilatation.

Pancreas: Unremarkable. No pancreatic ductal dilatation or
surrounding inflammatory changes.

Spleen: Normal in size without focal abnormality.

Adrenals/Urinary Tract: Adrenal glands are unremarkable. Kidneys are
normal, without renal calculi, focal lesion, or hydronephrosis.
Bladder is unremarkable.

Stomach/Bowel: Stomach nonenlarged. No dilated small bowel. Slight
enlargement of the appendix at 9 mm, not much surrounding
inflammation. Questionable mild rectal wall thickening.

Vascular/Lymphatic: Prominent right lower quadrant lymph nodes,
measuring up to 12 mm. Non aneurysmal aorta.

Reproductive: Prostate is unremarkable.

Other: Negative for free air or free fluid

Musculoskeletal: No acute or suspicious bone lesion
IMPRESSION: 1. Slightly enlarged appendix up to 9 mm but not much surrounding
inflammation, clinical correlation is recommended. There are
prominent right lower quadrant mesenteric lymph nodes nonspecific
but could be seen with adenitis
2. Questionable mild rectal wall thickening, query proctitis. No
other significant bowel wall thickening is evident

## 2018-04-07 ENCOUNTER — Other Ambulatory Visit: Payer: Self-pay

## 2018-04-07 ENCOUNTER — Encounter (HOSPITAL_COMMUNITY): Payer: Self-pay | Admitting: Emergency Medicine

## 2018-04-07 ENCOUNTER — Emergency Department (HOSPITAL_COMMUNITY)
Admission: EM | Admit: 2018-04-07 | Discharge: 2018-04-07 | Disposition: A | Discharge status: Home / Self Care | Payer: Self-pay | Attending: Emergency Medicine | Admitting: Emergency Medicine

## 2018-04-07 DIAGNOSIS — J039 Acute tonsillitis, unspecified: Secondary | ICD-10-CM | POA: Insufficient documentation

## 2018-04-07 LAB — GROUP A STREP BY PCR: Group A Strep by PCR: NOT DETECTED

## 2018-04-07 LAB — INFLUENZA PANEL BY PCR (TYPE A & B)
INFLAPCR: NEGATIVE
INFLBPCR: NEGATIVE

## 2018-04-07 MED ORDER — IBUPROFEN 800 MG PO TABS: 800.0000 mg | ORAL_TABLET | Freq: Three times a day (TID) | ORAL | 0 refills | Status: DC

## 2018-04-07 MED ORDER — AMOXICILLIN 250 MG PO CAPS
500.0000 mg | ORAL_CAPSULE | Freq: Once | ORAL | Status: AC
  Administered 2018-04-07: 500 mg via ORAL
  Filled 2018-04-07: qty 2

## 2018-04-07 MED ORDER — IBUPROFEN 800 MG PO TABS
800.0000 mg | ORAL_TABLET | Freq: Once | ORAL | Status: AC
  Administered 2018-04-07: 800 mg via ORAL
  Filled 2018-04-07: qty 1

## 2018-04-07 MED ORDER — ACETAMINOPHEN 325 MG PO TABS
650.0000 mg | ORAL_TABLET | Freq: Once | ORAL | Status: AC
  Administered 2018-04-07: 650 mg via ORAL
  Filled 2018-04-07: qty 2

## 2018-04-07 MED ORDER — MAGIC MOUTHWASH W/LIDOCAINE: 5.0000 mL | Freq: Three times a day (TID) | ORAL | 0 refills | Status: DC | PRN

## 2018-04-07 MED ORDER — AMOXICILLIN 500 MG PO CAPS: 500.0000 mg | ORAL_CAPSULE | Freq: Three times a day (TID) | ORAL | 0 refills | Status: DC

## 2018-04-07 NOTE — ED Triage Notes (Signed)
Pt c/o of a sore throat, chills, fever, and generalized body aches x 2 days.

## 2018-04-07 NOTE — ED Notes (Signed)
Pt has chills , temp rechecked, PA made aware of vitals

## 2018-04-07 NOTE — ED Notes (Signed)
Pt drinking water 

## 2018-04-07 NOTE — ED Notes (Signed)
Patient given discharge instruction, verbalized understand. Patient ambulatory out of the department.  

## 2018-04-07 NOTE — Discharge Instructions (Addendum)
It is important that you drink plenty of fluids.  Take extra strength Tylenol 1 tablet every 4 hours as needed for fever.  You may alternate that in between your ibuprofen doses.  Follow-up with your primary doctor or return here for any worsening symptoms such as persistent fever, swelling of your throat, and increasing difficulty swallowing

## 2018-04-08 ENCOUNTER — Encounter (HOSPITAL_COMMUNITY): Payer: Self-pay | Admitting: Emergency Medicine

## 2018-04-08 ENCOUNTER — Emergency Department (HOSPITAL_COMMUNITY)
Admission: EM | Admit: 2018-04-08 | Discharge: 2018-04-08 | Disposition: A | Discharge status: Home / Self Care | Payer: Self-pay | Attending: Emergency Medicine | Admitting: Emergency Medicine

## 2018-04-08 ENCOUNTER — Emergency Department (HOSPITAL_COMMUNITY): Payer: Self-pay

## 2018-04-08 ENCOUNTER — Other Ambulatory Visit: Payer: Self-pay

## 2018-04-08 DIAGNOSIS — J111 Influenza due to unidentified influenza virus with other respiratory manifestations: Secondary | ICD-10-CM | POA: Insufficient documentation

## 2018-04-08 DIAGNOSIS — R509 Fever, unspecified: Secondary | ICD-10-CM

## 2018-04-08 DIAGNOSIS — Z79899 Other long term (current) drug therapy: Secondary | ICD-10-CM | POA: Insufficient documentation

## 2018-04-08 DIAGNOSIS — E860 Dehydration: Secondary | ICD-10-CM | POA: Insufficient documentation

## 2018-04-08 DIAGNOSIS — R69 Illness, unspecified: Secondary | ICD-10-CM

## 2018-04-08 LAB — CBC WITH DIFFERENTIAL/PLATELET
BASOS PCT: 0 %
Basophils Absolute: 0 10*3/uL (ref 0.0–0.1)
Eosinophils Absolute: 0 10*3/uL (ref 0.0–0.7)
Eosinophils Relative: 0 %
HEMATOCRIT: 43.9 % (ref 39.0–52.0)
Hemoglobin: 14.4 g/dL (ref 13.0–17.0)
Lymphocytes Relative: 10 %
Lymphs Abs: 1.4 10*3/uL (ref 0.7–4.0)
MCH: 28.1 pg (ref 26.0–34.0)
MCHC: 32.8 g/dL (ref 30.0–36.0)
MCV: 85.6 fL (ref 78.0–100.0)
MONO ABS: 1.6 10*3/uL — AB (ref 0.1–1.0)
MONOS PCT: 11 %
NEUTROS ABS: 11.2 10*3/uL — AB (ref 1.7–7.7)
Neutrophils Relative %: 79 %
Platelets: 266 10*3/uL (ref 150–400)
RBC: 5.13 MIL/uL (ref 4.22–5.81)
RDW: 14.1 % (ref 11.5–15.5)
WBC: 14.3 10*3/uL — ABNORMAL HIGH (ref 4.0–10.5)

## 2018-04-08 LAB — URINALYSIS, ROUTINE W REFLEX MICROSCOPIC
BILIRUBIN URINE: NEGATIVE
GLUCOSE, UA: NEGATIVE mg/dL
Hgb urine dipstick: NEGATIVE
KETONES UR: NEGATIVE mg/dL
Leukocytes, UA: NEGATIVE
NITRITE: NEGATIVE
PH: 6 (ref 5.0–8.0)
Protein, ur: NEGATIVE mg/dL
Specific Gravity, Urine: 1.013 (ref 1.005–1.030)

## 2018-04-08 LAB — I-STAT CG4 LACTIC ACID, ED
Lactic Acid, Venous: 1.09 mmol/L (ref 0.5–1.9)
Lactic Acid, Venous: 0.98 mmol/L (ref 0.5–1.9)

## 2018-04-08 LAB — COMPREHENSIVE METABOLIC PANEL
ALK PHOS: 64 U/L (ref 38–126)
ALT: 22 U/L (ref 17–63)
ANION GAP: 11 (ref 5–15)
AST: 21 U/L (ref 15–41)
Albumin: 4.3 g/dL (ref 3.5–5.0)
BUN: 13 mg/dL (ref 6–20)
CALCIUM: 9 mg/dL (ref 8.9–10.3)
CO2: 24 mmol/L (ref 22–32)
CREATININE: 0.85 mg/dL (ref 0.61–1.24)
Chloride: 103 mmol/L (ref 101–111)
GFR calc Af Amer: >60 mL/min (ref >60)
GFR calc non Af Amer: >60 mL/min (ref >60)
GLUCOSE: 128 mg/dL — AB (ref 65–99)
Potassium: 3.8 mmol/L (ref 3.5–5.1)
SODIUM: 138 mmol/L (ref 135–145)
Total Bilirubin: 0.8 mg/dL (ref 0.3–1.2)
Total Protein: 8.1 g/dL (ref 6.5–8.1)

## 2018-04-08 MED ORDER — SODIUM CHLORIDE 0.9 % IV BOLUS
1000.0000 mL | Freq: Once | INTRAVENOUS | Status: AC
Start: 2018-04-08 — End: 2018-04-08
  Administered 2018-04-08: 1000 mL via INTRAVENOUS

## 2018-04-08 MED ORDER — SODIUM CHLORIDE 0.9 % IV BOLUS
2000.0000 mL | Freq: Once | INTRAVENOUS | Status: AC
  Administered 2018-04-08: 2000 mL via INTRAVENOUS

## 2018-04-08 MED ORDER — IBUPROFEN 800 MG PO TABS
800.0000 mg | ORAL_TABLET | Freq: Once | ORAL | Status: AC
  Administered 2018-04-08: 800 mg via ORAL
  Filled 2018-04-08: qty 1

## 2018-04-08 MED ORDER — HYDROCODONE-ACETAMINOPHEN 5-325 MG PO TABS: 1.0000 | ORAL_TABLET | Freq: Four times a day (QID) | ORAL | 0 refills | Status: DC | PRN

## 2018-04-08 MED ORDER — KETOROLAC TROMETHAMINE 30 MG/ML IJ SOLN
30.0000 mg | Freq: Once | INTRAMUSCULAR | Status: AC
  Administered 2018-04-08: 30 mg via INTRAVENOUS
  Filled 2018-04-08: qty 1

## 2018-04-08 MED ORDER — ONDANSETRON HCL 4 MG/2ML IJ SOLN
4.0000 mg | Freq: Once | INTRAMUSCULAR | Status: AC
  Administered 2018-04-08: 4 mg via INTRAVENOUS
  Filled 2018-04-08: qty 2

## 2018-04-08 MED ORDER — SODIUM CHLORIDE 0.9 % IV SOLN
INTRAVENOUS | Status: DC
  Administered 2018-04-08: 13:00:00 via INTRAVENOUS

## 2018-04-08 MED ORDER — ACETAMINOPHEN 500 MG PO TABS
1000.0000 mg | ORAL_TABLET | Freq: Once | ORAL | Status: AC
  Administered 2018-04-08: 1000 mg via ORAL
  Filled 2018-04-08: qty 2

## 2018-04-08 NOTE — ED Triage Notes (Addendum)
Pt c/o continued fever, body aches, chills, and sore throat. Also reports vomiting 3 x today. Pt evaluated in ED yesterday with same complaint. Last tylenol and motrin was last night.

## 2018-04-08 NOTE — Discharge Instructions (Addendum)
Continue the medications that were prescribed yesterday.  Take the hydrocodone to help with the throat pain.  Would continue taking Tylenol and/or Motrin for the fevers.  Work on hydrating herself with liquids Gatorade would be ideal.  Return for any new or worse symptoms.  Work note provided.  Would expect improvement over the next few days.

## 2018-04-08 NOTE — ED Provider Notes (Signed)
[facility] EMERGENCY DEPARTMENT Provider Note   CSN: [id number] Arrival date & time: 04/08/18  1143     History   Chief Complaint Chief Complaint  Patient presents with  . Fever    HPI Wayne Reed is a 22 y.o. male.  Patient seen yesterday in the emergency department.  Patient at that time had the complaint of body aches for 2 days sore throat chills and fever.  Patient had a temp of maximum 103.2 yesterday.  And had some tachycardia.  Patient's rapid strep was negative and influenza testing was negative.  Patient was treated symptomatically.  Patient returns today saying that he is had continued fever body aches chills and sore throat.  And reportedly vomited 3 times today.  No diarrhea.     History reviewed. No pertinent past medical history.  There are no active problems to display for this patient.   History reviewed. No pertinent surgical history.      Home Medications    Prior to Admission medications   Medication Sig Start Date End Date Taking? Authorizing Provider  acetaminophen (TYLENOL) 500 MG tablet Take 1,000 mg by mouth every 6 (six) hours as needed.   Yes [provider]  amoxicillin (AMOXIL) 500 MG capsule Take 1 capsule (500 mg total) by mouth 3 (three) times daily. 04/07/18  Yes Triplett, Tammy, PA-C  ibuprofen (ADVIL,MOTRIN) 800 MG tablet Take 800 mg by mouth every 8 (eight) hours as needed.   Yes [provider]  HYDROcodone-acetaminophen (NORCO/VICODIN) 5-325 MG tablet Take 1-2 tablets by mouth every 6 (six) hours as needed. 04/08/18   Vanetta Mulders, MD    Family History No family history on file.  Social History Social History   Tobacco Use  . Smoking status: Never Smoker  . Smokeless tobacco: Never Used  Substance Use Topics  . Alcohol use: No  . Drug use: No     Allergies   Patient has no known allergies.   Review of Systems Review of Systems  Constitutional: Positive for fever.  HENT: Positive for  sore throat and trouble swallowing.   Eyes: Negative for redness.  Respiratory: Negative for shortness of breath.   Cardiovascular: Negative for chest pain.  Gastrointestinal: Positive for nausea and vomiting. Negative for abdominal pain.  Genitourinary: Negative for dysuria.  Musculoskeletal: Positive for myalgias.  Skin: Negative for rash.  Neurological: Negative for headaches.  Hematological: Does not bruise/bleed easily.  Psychiatric/Behavioral: Negative for confusion.     Physical Exam Updated Vital Signs BP 106/69   Pulse 80   Temp 98 F (36.7 C) (Oral)   Resp 16   Ht 1.956 m (6\' 5" )   Wt 116.1 kg (256 lb)   SpO2 98%   BMI 30.36 kg/m   Physical Exam  Constitutional: [name] oriented to person, place, and time. He appears well-developed and well-nourished. No distress.  HENT:  Head: Normocephalic and atraumatic.  Mouth/Throat: No oropharyngeal exudate.  Mucous membranes dry.  Posterior pharynx erythema uvula midline no significant soft palate swelling.  Eyes: Pupils are equal, round, and reactive to light. Conjunctivae and EOM are normal.  Neck: Neck supple.  Cardiovascular: Regular rhythm.  Tachycardic  Pulmonary/Chest: Effort normal and breath sounds normal. No respiratory distress. He has no wheezes.  Abdominal: Soft. Bowel sounds are normal. There is no tenderness.  Musculoskeletal: Normal range of motion. He exhibits no edema.  Neurological: [name] alert and oriented to person, place, and time. No cranial nerve deficit or sensory deficit. He  exhibits normal muscle tone. Coordination normal.  Skin: Skin is warm. No rash noted.  Nursing note and vitals reviewed.    ED Treatments / Results  Labs (all labs ordered are listed, but only abnormal results are displayed) Labs Reviewed  COMPREHENSIVE METABOLIC PANEL - Abnormal; Notable for the following components:      Result Value   Glucose, Bld 128 (*)    All other components within normal limits  CBC WITH  DIFFERENTIAL/PLATELET - Abnormal; Notable for the following components:   WBC 14.3 (*)    Neutro Abs 11.2 (*)    Monocytes Absolute 1.6 (*)    All other components within normal limits  URINALYSIS, ROUTINE W REFLEX MICROSCOPIC  I-STAT CG4 LACTIC ACID, ED  I-STAT CG4 LACTIC ACID, ED    EKG None  Radiology Dg Chest 2 View  Result Date: 04/08/2018 CLINICAL DATA:  Fever and cough. EXAM: CHEST - 2 VIEW COMPARISON:  01/20/2017 and prior radiographs FINDINGS: The cardiomediastinal silhouette is unremarkable. There is no evidence of focal airspace disease, pulmonary edema, suspicious pulmonary nodule/mass, pleural effusion, or pneumothorax. No acute bony abnormalities are identified. IMPRESSION: No active cardiopulmonary disease. Electronically Signed   By: Harmon PierJeffrey  Hu M.D.   On: 04/08/2018 13:45    Procedures Procedures (including critical care time)  Medications Ordered in ED Medications  0.9 %  sodium chloride infusion ( Intravenous Stopped 04/08/18 1909)  ondansetron (ZOFRAN) injection 4 mg (4 mg Intravenous Given 04/08/18 1252)  sodium chloride 0.9 % bolus 2,000 mL (0 mLs Intravenous Stopped 04/08/18 1356)  acetaminophen (TYLENOL) tablet 1,000 mg (1,000 mg Oral Given 04/08/18 1252)  ibuprofen (ADVIL,MOTRIN) tablet 800 mg (800 mg Oral Given 04/08/18 1415)  sodium chloride 0.9 % bolus 1,000 mL (0 mLs Intravenous Stopped 04/08/18 2015)  ketorolac (TORADOL) 30 MG/ML injection 30 mg (30 mg Intravenous Given 04/08/18 1909)     Initial Impression / Assessment and Plan / ED Course  I have reviewed the triage vital signs and the nursing notes.  Pertinent labs & imaging results that were available during my care of the patient were reviewed by me and considered in my medical decision making (see chart for details).     Patient with extensive workup here today to include things that were not done yesterday.  His lactic acid x2 is been normal.  Patient received 3 L of normal saline before he had  to urinate.  Did have a leukocytosis of 14,000.  Basic chemistries were normal no significant renal function and liver function tests were negative.  Chest x-ray also negative for pneumonia.  Urinalysis negative.  Patient overall better.  Did receive Zofran at about 1 in the afternoon has had no recurrence of any nausea and vomiting.  Feel that this is a viral type process.  Influenza testing yesterday was negative and strep testing was negative.  Will have supplement patient's treatment course from yesterday with hydrocodone to help with his main complaint which is been the throat pain.  Examination of his throat showed erythema no exudate uvula was midline no evidence of any peritonsillar abscess clinically.  Again feel that symptoms are viral in nature flulike in nature.  Following the 3 L of fluid.  Had treatment for the fever he is now afebrile not tachycardic patient states still not feeling 100% but does feel improved from when he arrived.  Stable for discharge home he will return for any new or worse symptoms.  Final Clinical Impressions(s) / ED Diagnoses   Final diagnoses:  Influenza-like illness  Fever, unspecified fever cause  Dehydration    ED Discharge Orders        Ordered    HYDROcodone-acetaminophen (NORCO/VICODIN) 5-325 MG tablet  Every 6 hours PRN     04/08/18 2103       Vanetta Mulders, MD 04/08/18 2112

## 2018-04-08 NOTE — ED Provider Notes (Signed)
[facility]ERGENCY [facility]PART[facility] Provider Note   CSN: [id number] Arrival date & time: 04/07/18  1141     History   Chief Complaint Chief Complaint  Patient presents with  . Sore Throat    HPI Wayne Reed is a 22 y.o. male.  HPI  Wayne Reed is a 22 y.o. male who presents to the Emergency Department complaining of generalized body aches and sore throat.  Symptoms have been present for 2 days and associated with fever and intermittent chills.  Taking tylenol with some relief of fever.  States he is drinking fluids, but appetite diminished.  No recent sick contacts.  He denies abdominal pain, vomiting, diarrhea, headache or neck stiffness.  No rash, cough, dysuria, or shortness of breath.   History reviewed. No pertinent past medical history.  There are no active problems to display for this patient.   History reviewed. No pertinent surgical history.      Home Medications    Prior to Admission medications   Medication Sig Start Date End Date Taking? Authorizing Provider  acetaminophen (TYLENOL) 500 MG tablet Take 1,000 mg by mouth every 6 (six) hours as needed.    [provider]  amoxicillin (AMOXIL) 500 MG capsule Take 1 capsule (500 mg total) by mouth 3 (three) times daily. 04/07/18   Regie Bunner, PA-C  HYDROcodone-acetaminophen (NORCO/VICODIN) 5-325 MG tablet Take 1-2 tablets by mouth every 6 (six) hours as needed. 04/08/18   Vanetta MuldersZackowski, Scott, MD  ibuprofen (ADVIL,MOTRIN) 800 MG tablet Take 800 mg by mouth every 8 (eight) hours as needed.    [provider]    Family History History reviewed. No pertinent family history.  Social History Social History   Tobacco Use  . Smoking status: Never Smoker  . Smokeless tobacco: Never Used  Substance Use Topics  . Alcohol use: No  . Drug use: No     Allergies   Patient has no known allergies.   Review of Systems Review of Systems  Constitutional: Positive for appetite change,  chills and fever. Negative for activity change.  HENT: Positive for sore throat. Negative for congestion, ear pain, facial swelling, trouble swallowing and voice change.   Eyes: Negative for pain and visual disturbance.  Respiratory: Negative for cough and shortness of breath.   Cardiovascular: Negative for chest pain.  Gastrointestinal: Negative for abdominal pain, nausea and vomiting.  Genitourinary: Negative for dysuria.  Musculoskeletal: Positive for myalgias. Negative for arthralgias, neck pain and neck stiffness.  Skin: Negative for color change and rash.  Neurological: Negative for dizziness, facial asymmetry, speech difficulty, numbness and headaches.  Hematological: Negative for adenopathy.  All other systems reviewed and are negative.    Physical Exam Updated Vital Signs BP 119/70 (BP Location: Right Arm)   Pulse (!) 102   Temp (!) 100.4 F (38 C)   Resp 18   Ht 6\' 5"  (1.956 m)   Wt 116.1 kg (256 lb)   SpO2 99%   BMI 30.36 kg/m   Physical Exam  Constitutional: He appears well-developed and well-nourished. No distress.  HENT:  Head: Normocephalic and atraumatic.  Right Ear: Tympanic membrane and ear canal normal.  Left Ear: Tympanic membrane and ear canal normal.  Mouth/Throat: Uvula is midline and mucous membranes are normal. No oral lesions. No trismus in the jaw. No uvula swelling. Posterior oropharyngeal edema and posterior oropharyngeal erythema present. No oropharyngeal exudate or tonsillar abscesses.  Mild edema of the bilateral tonsils.  Uvula is midline and non-edematous.  No PTA.  Neck: Normal range of motion. Neck supple.  Cardiovascular: Normal rate, regular rhythm and normal heart sounds.  No murmur heard. Pulmonary/Chest: Effort normal and breath sounds normal. He has no wheezes. He has no rales.  Abdominal: Soft. Normal appearance. There is no splenomegaly. There is no tenderness.  Musculoskeletal: Normal range of motion.  Lymphadenopathy:    He has  no cervical adenopathy.  Neurological: [name] alert. He exhibits normal muscle tone.  Skin: Skin is warm and dry.  Psychiatric: He has a normal mood and affect.  Nursing note and vitals reviewed.    ED Treatments / Results  Labs (all labs ordered are listed, but only abnormal results are displayed) Labs Reviewed  GROUP A STREP BY PCR  INFLUENZA PANEL BY PCR (TYPE A & B)    EKG None  Radiology   Procedures Procedures (including critical care time)  Medications Ordered in ED Medications  ibuprofen (ADVIL,MOTRIN) tablet 800 mg (800 mg Oral Given 04/07/18 1303)  acetaminophen (TYLENOL) tablet 650 mg (650 mg Oral Given 04/07/18 1344)  amoxicillin (AMOXIL) capsule 500 mg (500 mg Oral Given 04/07/18 1547)     Initial Impression / Assessment and Plan / ED Course  I have reviewed the triage vital signs and the nursing notes.  Pertinent labs & imaging results that were available during my care of the patient were reviewed by me and considered in my medical decision making (see chart for details).     Pt uncomfortable appearing but non-toxic.  Mucous membranes are moist.  Strep screen neg.  Pt tolerated po fluids.   Fever spiked here, heart rate improved.  Given ibuprofen and tylenol and I will order influenza swab.   Fever and heart rate improved.  Flu swab also neg.  Pt reports feeling somewhat better.  Appears stable for d/c.  Given that sx's are mainly sore throat, and tonsils enlarged bilaterally, I will cover with abx., magic mouthwash for pain relief and ibuprofen.  Pt agrees to tx plan and return precautions discussed.    Final Clinical Impressions(s) / ED Diagnoses   Final diagnoses:  Tonsillitis    ED Discharge Orders        Ordered    amoxicillin (AMOXIL) 500 MG capsule  3 times daily     04/07/18 1545    magic mouthwash w/lidocaine SOLN  3 times daily PRN,   Status:  Discontinued     04/07/18 1545    ibuprofen (ADVIL,MOTRIN) 800 MG tablet  3 times daily,   Status:   Discontinued     04/07/18 1545       Ikran Patman, [place], PA-C 04/08/18 2343    Bethann Berkshire, MD 04/13/18 [phone]

## 2018-07-16 IMAGING — DX DG CHEST 2V
2 series · 2 of 2 positions shown · non-contrast
Comparison: 01/20/2017 and prior radiographs

CLINICAL DATA: Fever and cough.

EXAM:
CHEST - 2 VIEW

[chest pa]
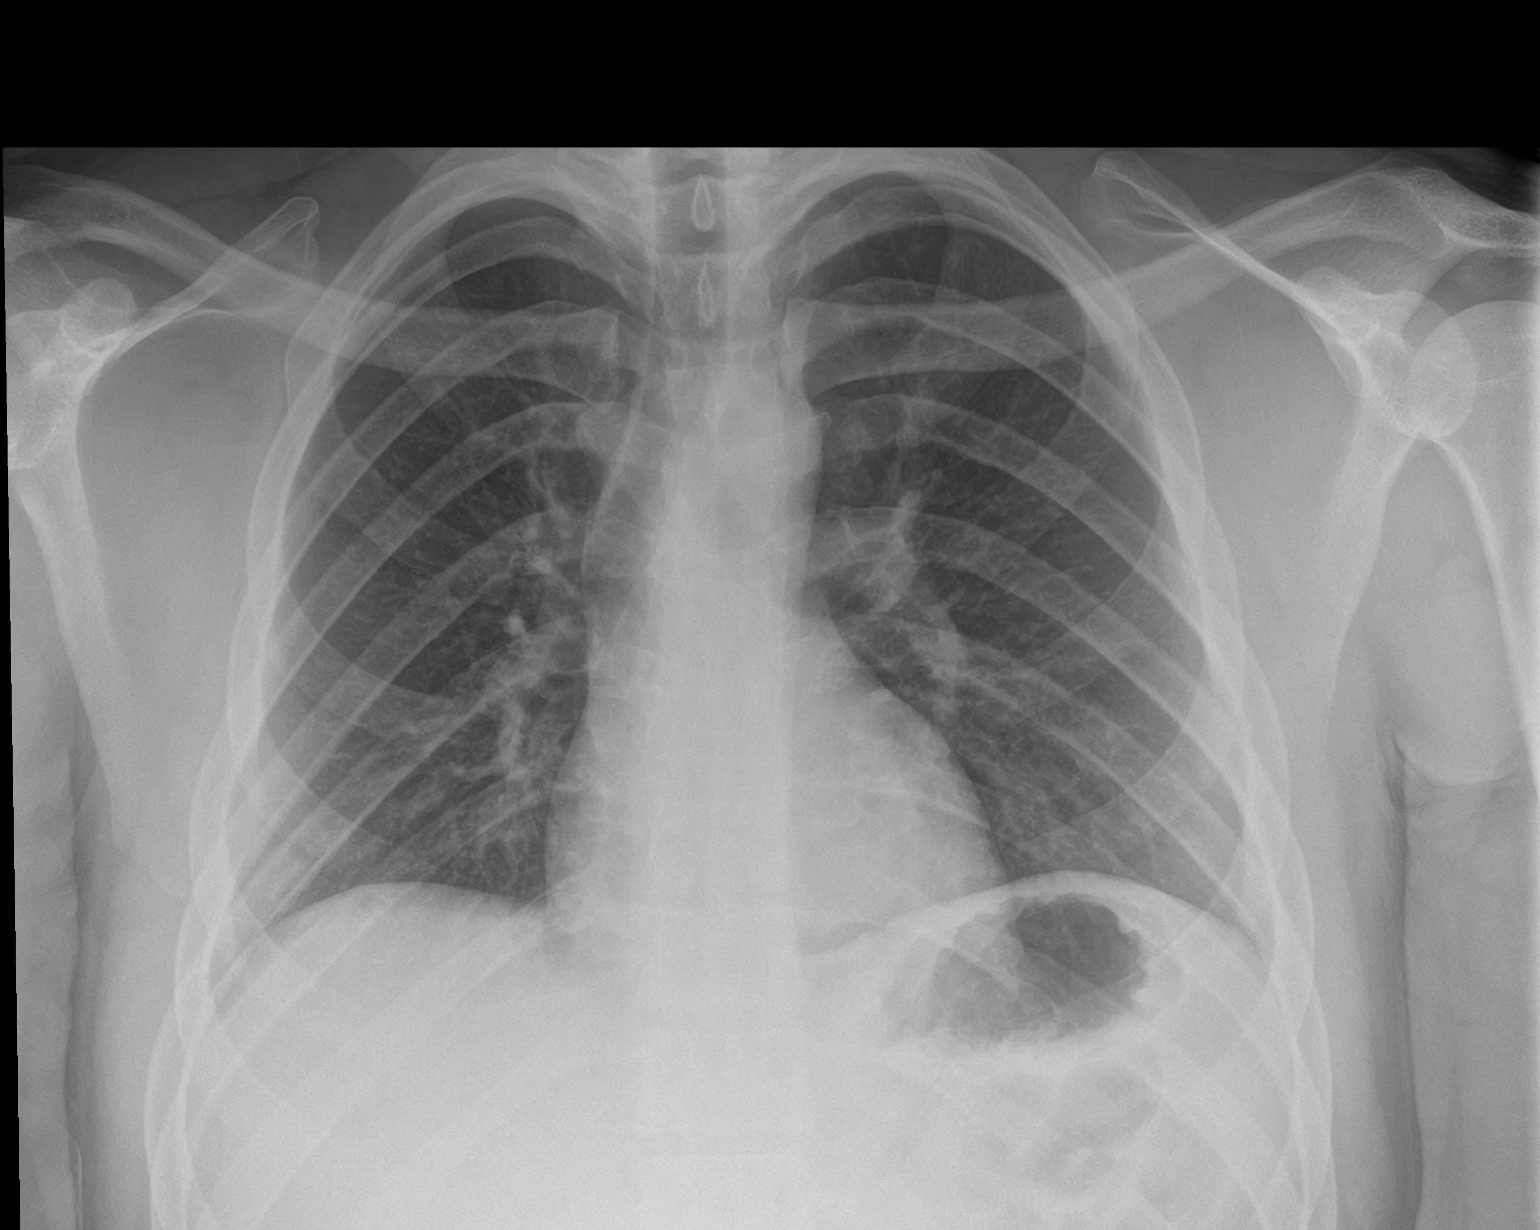

[chest lat]
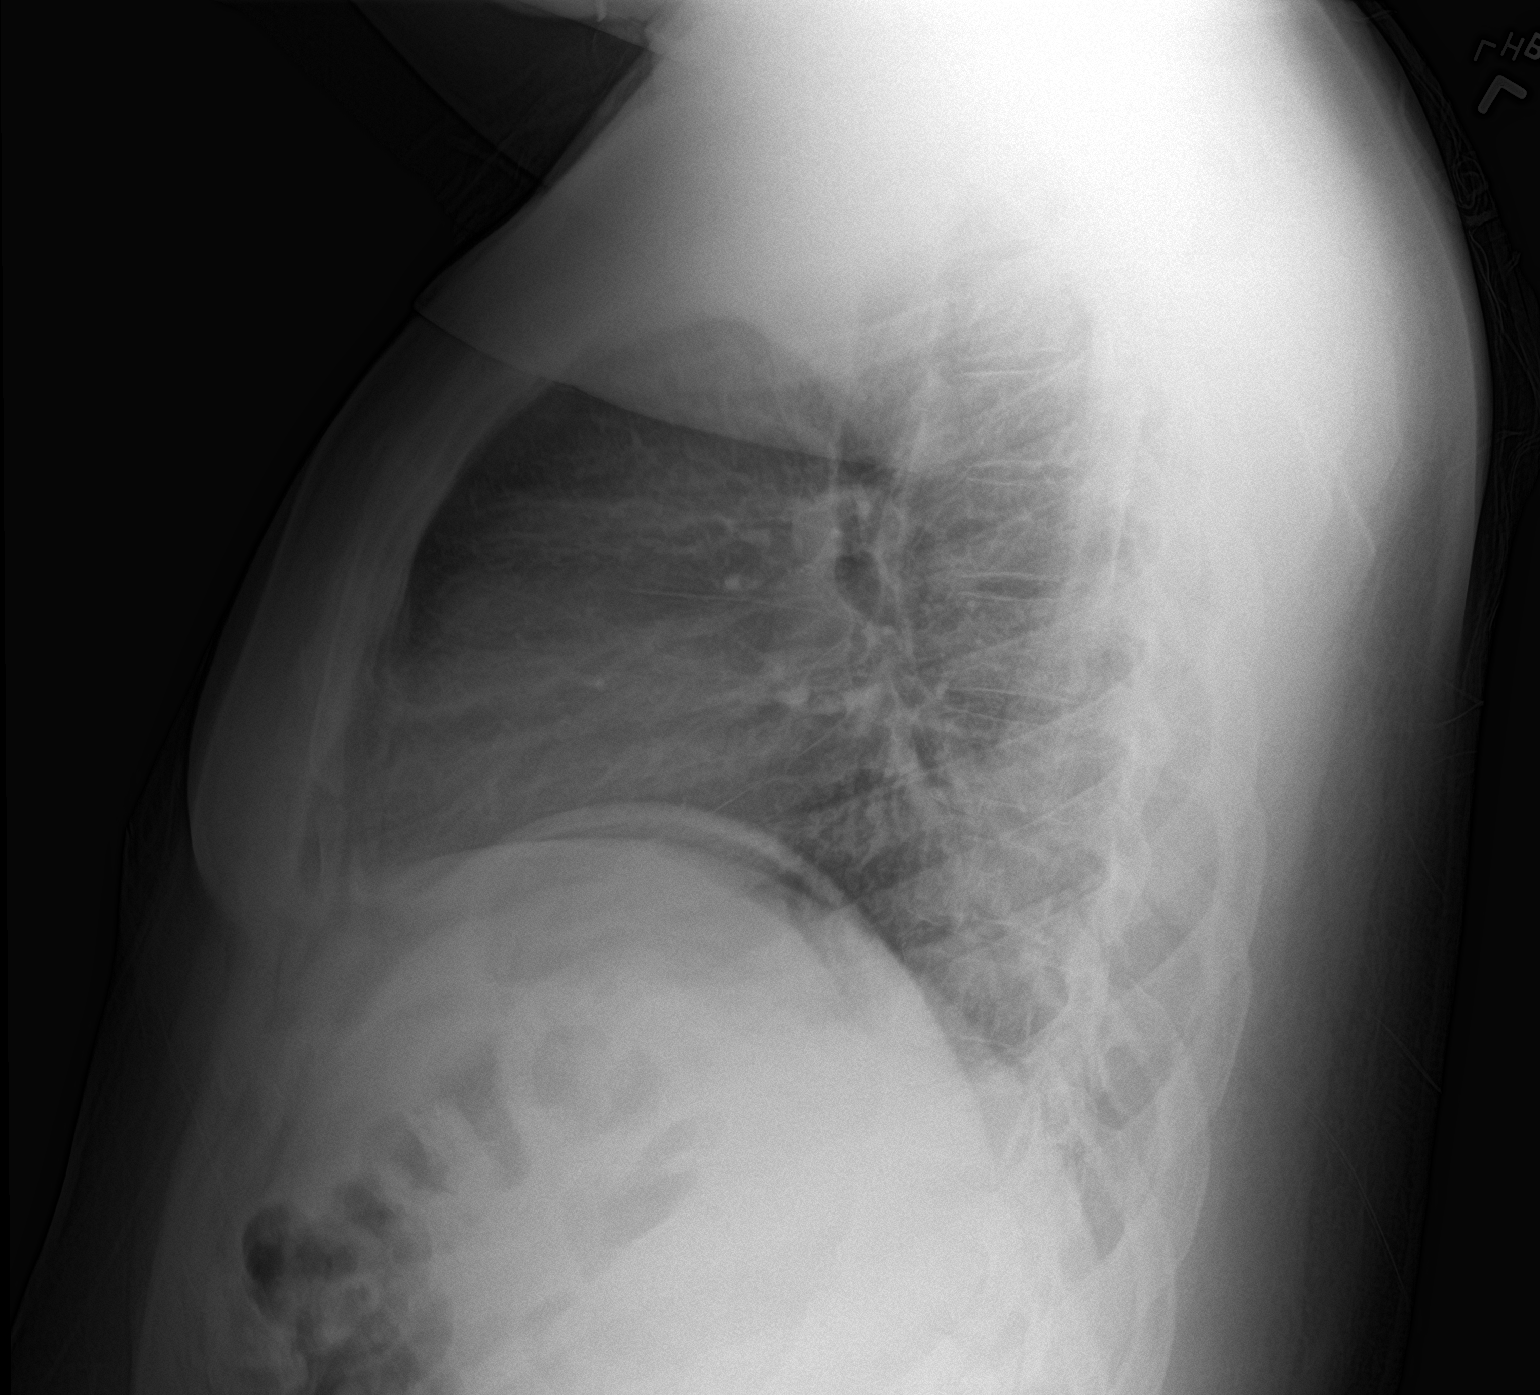

[2 of 2 positions shown; findings below may reference images not displayed]

FINDINGS: The cardiomediastinal silhouette is unremarkable.

There is no evidence of focal airspace disease, pulmonary edema,
suspicious pulmonary nodule/mass, pleural effusion, or pneumothorax.

No acute bony abnormalities are identified.
IMPRESSION: No active cardiopulmonary disease.

## 2018-12-13 ENCOUNTER — Encounter (HOSPITAL_COMMUNITY): Payer: Self-pay | Admitting: Emergency Medicine

## 2018-12-13 ENCOUNTER — Other Ambulatory Visit: Payer: Self-pay

## 2018-12-13 ENCOUNTER — Emergency Department (HOSPITAL_COMMUNITY)
Admission: EM | Admit: 2018-12-13 | Discharge: 2018-12-13 | Disposition: A | Discharge status: Home / Self Care | Payer: Self-pay | Attending: Emergency Medicine | Admitting: Emergency Medicine

## 2018-12-13 DIAGNOSIS — J111 Influenza due to unidentified influenza virus with other respiratory manifestations: Secondary | ICD-10-CM | POA: Insufficient documentation

## 2018-12-13 LAB — INFLUENZA PANEL BY PCR (TYPE A & B)
INFLBPCR: POSITIVE — AB
Influenza A By PCR: NEGATIVE

## 2018-12-13 LAB — GROUP A STREP BY PCR: Group A Strep by PCR: NOT DETECTED

## 2018-12-13 MED ORDER — OSELTAMIVIR PHOSPHATE 75 MG PO CAPS: 75.0000 mg | ORAL_CAPSULE | Freq: Two times a day (BID) | ORAL | 0 refills | Status: DC

## 2018-12-13 MED ORDER — ACETAMINOPHEN 500 MG PO TABS
1000.0000 mg | ORAL_TABLET | Freq: Once | ORAL | Status: AC
  Administered 2018-12-13: 1000 mg via ORAL
  Filled 2018-12-13: qty 2

## 2018-12-13 MED ORDER — IBUPROFEN 400 MG PO TABS
400.0000 mg | ORAL_TABLET | Freq: Once | ORAL | Status: AC
  Administered 2018-12-13: 400 mg via ORAL
  Filled 2018-12-13: qty 1

## 2018-12-13 MED ORDER — OSELTAMIVIR PHOSPHATE 75 MG PO CAPS
75.0000 mg | ORAL_CAPSULE | Freq: Once | ORAL | Status: AC
  Administered 2018-12-13: 75 mg via ORAL
  Filled 2018-12-13: qty 1

## 2018-12-13 NOTE — Discharge Instructions (Signed)
Your test is positive for the flu.  Please use Tylenol 1000 mg every 4 hours, or ibuprofen 600 mg every 6 hours for fever and aching.  Please use your mask until symptoms have resolved.  Please wash hands frequently.  You may need to wipe down surfaces to prevent spread of the flu.  Salt water gargles and Chloraseptic spray may be helpful for your sore throat.Use Tamiflu over the next 5 days. See your MD or return to the ED if any changes or problem.

## 2018-12-13 NOTE — ED Provider Notes (Signed)
[facility]   CSN: [id number] Arrival date & time: 12/13/18  0907     History   Chief Complaint Chief Complaint  Patient presents with  . Sore Throat    HPI Wayne Reed is a 22 y.o. male.  The history is provided by the patient.  Sore Throat  This is a new problem. The current episode started more than 2 days ago. The problem occurs daily. The problem has been gradually worsening. Associated symptoms include headaches. Pertinent negatives include no chest pain, no abdominal pain and no shortness of breath. The symptoms are aggravated by coughing and swallowing. Nothing relieves the symptoms. He has tried acetaminophen for the symptoms. The treatment provided no relief.    History reviewed. No pertinent past medical history.  There are no active problems to display for this patient.   History reviewed. No pertinent surgical history.      Home Medications    Prior to Admission medications   Medication Sig Start Date End Date Taking? Authorizing Provider  acetaminophen (TYLENOL) 500 MG tablet Take 1,000 mg by mouth every 6 (six) hours as needed.    [provider]  amoxicillin (AMOXIL) 500 MG capsule Take 1 capsule (500 mg total) by mouth 3 (three) times daily. 04/07/18   Triplett, Tammy, PA-C  HYDROcodone-acetaminophen (NORCO/VICODIN) 5-325 MG tablet Take 1-2 tablets by mouth every 6 (six) hours as needed. 04/08/18   Vanetta MuldersZackowski, Scott, MD  ibuprofen (ADVIL,MOTRIN) 800 MG tablet Take 800 mg by mouth every 8 (eight) hours as needed.    [provider]    Family History History reviewed. No pertinent family history.  Social History Social History   Tobacco Use  . Smoking status: Never Smoker  . Smokeless tobacco: Never Used  Substance Use Topics  . Alcohol use: No  . Drug use: No     Allergies   Patient has no known allergies.   Review of Systems Review of Systems  Constitutional: Positive for chills  and fever. Negative for activity change.       All ROS Neg except as noted in HPI  HENT: Positive for congestion. Negative for nosebleeds.   Eyes: Negative for photophobia and discharge.  Respiratory: Negative for cough, shortness of breath and wheezing.   Cardiovascular: Negative for chest pain and palpitations.  Gastrointestinal: Negative for abdominal pain and blood in stool.  Genitourinary: Negative for dysuria, frequency and hematuria.  Musculoskeletal: Negative for arthralgias, back pain and neck pain.  Skin: Negative.   Neurological: Positive for headaches. Negative for dizziness, seizures and speech difficulty.  Psychiatric/Behavioral: Negative for confusion and hallucinations.     Physical Exam Updated Vital Signs BP 140/82 (BP Location: Left Arm)   Pulse 99   Temp (!) 100.5 F (38.1 C) (Oral)   Resp 16   Ht 6\' 5"  (1.956 m)   Wt 127 kg   SpO2 100%   BMI 33.20 kg/m   Physical Exam Vitals signs and nursing note reviewed.  Constitutional:      Appearance: [name] well-developed. [name] not toxic-appearing.  HENT:     Head: Normocephalic.     Right Ear: Tympanic membrane and external ear normal.     Left Ear: Tympanic membrane and external ear normal.     Nose: Congestion present.  Eyes:     General: Lids are normal.     Pupils: Pupils are equal, round, and reactive to light.  Neck:     Musculoskeletal: Normal range  of motion and neck supple.     Vascular: No carotid bruit.  Cardiovascular:     Rate and Rhythm: Normal rate and regular rhythm.     Pulses: Normal pulses.     Heart sounds: Normal heart sounds.  Pulmonary:     Effort: No respiratory distress.     Breath sounds: Normal breath sounds.  Abdominal:     General: Bowel sounds are normal.     Palpations: Abdomen is soft.     Tenderness: There is no abdominal tenderness. There is no guarding.  Musculoskeletal: Normal range of motion.  Lymphadenopathy:     Head:     Right side of head: No submandibular  adenopathy.     Left side of head: No submandibular adenopathy.     Cervical: No cervical adenopathy.  Skin:    General: Skin is warm and dry.  Neurological:     Mental Status: [name] alert and oriented to person, place, and time.     Cranial Nerves: No cranial nerve deficit.     Sensory: No sensory deficit.  Psychiatric:        Speech: Speech normal.      ED Treatments / Results  Labs (all labs ordered are listed, but only abnormal results are displayed) Labs Reviewed - No data to display  EKG None  Radiology No results found.  Procedures Procedures (including critical care time)  Medications Ordered in ED Medications - No data to display   Initial Impression / Assessment and Plan / ED Course  I have reviewed the triage vital signs and the nursing notes.  Pertinent labs & imaging results that were available during my care of the patient were reviewed by me and considered in my medical decision making (see chart for details).       Final Clinical Impressions(s) / ED Diagnoses MDM  Vital signs reviewed.  Patient presents to the emergency department with upper respiratory symptoms.  [name] unsure if he has been around anyone who is been ill recently.  He has not had a flu shot for this year.  There is no hemoptysis reported. Strep test is negative.  Influenza is positive for influenza B.  I have given patient instructions on washing hands frequently, increasing fluids, keeping his distance from others, and I have provided a mask for the patient.  The patient will use Tylenol every 4 hours ibuprofen every 6 hours.  He will use over-the-counter medications for symptoms.  Prescription for Tamiflu also given to the patient.  Patient will see the primary physician or return to the emergency department if any changes in condition, problems, or concerns.   Final diagnoses:  Influenza    ED Discharge Orders         Ordered    oseltamivir (TAMIFLU) 75 MG capsule  Every 12  hours     12/13/18 1125           Ivery QualeBryant, Bobbyjoe [place], PA-C 12/13/18 1308    Samuel JesterMcManus, Kathleen, DO 12/15/18 0745

## 2018-12-13 NOTE — ED Triage Notes (Signed)
Patient complaining of sore throat, body aches, and chills x 4 days.

## 2018-12-28 ENCOUNTER — Encounter (HOSPITAL_BASED_OUTPATIENT_CLINIC_OR_DEPARTMENT_OTHER): Payer: Self-pay

## 2018-12-28 ENCOUNTER — Other Ambulatory Visit: Payer: Self-pay

## 2018-12-28 ENCOUNTER — Emergency Department (HOSPITAL_BASED_OUTPATIENT_CLINIC_OR_DEPARTMENT_OTHER)
Admission: EM | Admit: 2018-12-28 | Discharge: 2018-12-28 | Disposition: A | Discharge status: Home / Self Care | Payer: Self-pay | Attending: Emergency Medicine | Admitting: Emergency Medicine

## 2018-12-28 DIAGNOSIS — K649 Unspecified hemorrhoids: Secondary | ICD-10-CM | POA: Insufficient documentation

## 2018-12-28 HISTORY — DX: Unspecified hemorrhoids: K64.9

## 2018-12-28 MED ORDER — LIDOCAINE-HYDROCORTISONE ACE 3-0.5 % RE CREA: 1.0000 | TOPICAL_CREAM | Freq: Two times a day (BID) | RECTAL | 0 refills | Status: DC

## 2018-12-28 NOTE — ED Provider Notes (Signed)
[facility] EMERGENCY DEPARTMENT Provider Note   CSN: [id number] Arrival date & time: 12/28/18  1559     History   Chief Complaint Chief Complaint  Patient presents with  . Rectal Bleeding    HPI Wayne Reed is a 23 y.o. male a history of hemorrhoids who presents to the emergency department with complaints of rectal pain/bleeding x 2-3 days.  Patient states discomfort is constant, worse with bowel movements.  Has some rectal bleeding with bowel movements as well, this is only on the toilet paper & in the toilet bowl, does not seem to be mixed into stool, does not seem to be hematochezia or melena.  Other than bowel movements no specific alleviating or aggravating factors.  He states this feels similar to prior hemorrhoids in the past.  He did try Preparation H without much change.  Denies fever, chills, nausea, vomiting, abdominal pain, or dysuria.  Denies injury.  Denies concern for STD.  HPI  Past Medical History:  Diagnosis Date  . Hemorrhoid     There are no active problems to display for this patient.   History reviewed. No pertinent surgical history.      Home Medications    Prior to Admission medications   Medication Sig Start Date End Date Taking? Authorizing Provider  oseltamivir (TAMIFLU) 75 MG capsule Take 1 capsule (75 mg total) by mouth every 12 (twelve) hours. 12/13/18   Ivery Quale, PA-C    Family History No family history on file.  Social History Social History   Tobacco Use  . Smoking status: Never Smoker  . Smokeless tobacco: Never Used  Substance Use Topics  . Alcohol use: No  . Drug use: No     Allergies   Patient has no known allergies.   Review of Systems Review of Systems  Constitutional: Negative for chills and fever.  Gastrointestinal: Positive for anal bleeding and rectal pain. Negative for abdominal pain, diarrhea, nausea and vomiting.  Genitourinary: Negative for dysuria.     Physical Exam Updated  Vital Signs BP 131/70 (BP Location: Left Arm)   Pulse 74   Temp 97.9 F (36.6 C) (Oral)   Resp 18   Ht 6\' 6"  (1.981 m)   Wt 129.7 kg   SpO2 100%   BMI 33.05 kg/m   Physical Exam Vitals signs and nursing note reviewed. Exam conducted with a chaperone present.  Constitutional:      General: [name] not in acute distress.    Appearance: [name] well-developed.  HENT:     Head: Normocephalic and atraumatic.  Eyes:     General:        Right eye: No discharge.        Left eye: No discharge.     Conjunctiva/sclera: Conjunctivae normal.  Abdominal:     General: There is no distension.     Palpations: Abdomen is soft.     Tenderness: There is no abdominal tenderness.  Genitourinary:    Rectum: External hemorrhoid (There are 2 external hemorrhoids present at the 6:00 and 12:00 positions.  Each measures about 0.5 cm in diameter.  These do not appear thrombosed.  These are tender to palpation.) present.     Comments: No melena or gross blood. Neurological:     Mental Status: [name] alert.     Comments: Clear speech.   Psychiatric:        Behavior: Behavior normal.        Thought Content: Thought content normal.  ED Treatments / Results  Labs (all labs ordered are listed, but only abnormal results are displayed) Labs Reviewed - No data to display  EKG None  Radiology No results found.  Procedures Procedures (including critical care time)  Medications Ordered in ED Medications - No data to display   Initial Impression / Assessment and Plan / ED Course  I have reviewed the triage vital signs and the nursing notes.  Pertinent labs & imaging results that were available during my care of the patient were reviewed by me and considered in my medical decision making (see chart for details).   Patient presents to the emergency department with rectal pain/bleeding likely secondary to external hemorrhoids present on exam.  Hemorrhoids do not appear thrombosed.  Will treat with  hdyrocortisone/lidocaine topical therapy.  Discussed diet recommendations and sitz bath.  Primary care follow-up. I discussed treatment plan, need for follow-up, and return precautions with the patient. Provided opportunity for questions, patient confirmed understanding and is in agreement with plan.   Final Clinical Impressions(s) / ED Diagnoses   Final diagnoses:  Hemorrhoids, unspecified hemorrhoid type    ED Discharge Orders         Ordered    lidocaine-hydrocortisone [facility]) 3-0.5 % CREA  2 times daily     12/28/18 1649           Ludivina Guymon, [place] R, PA-C 12/28/18 1650    Melene Plan, DO 12/28/18 2255

## 2018-12-28 NOTE — ED Notes (Signed)
C/o rectal bleeding  Worse after bm,, ?  hemorrhoids

## 2018-12-28 NOTE — ED Triage Notes (Addendum)
C/o rectal bleeding/pain with BMs x 3 days-reports hx of hemorrhoids-NAD-steady gait

## 2018-12-28 NOTE — Discharge Instructions (Addendum)
You are seen in the ER today for hemorrhoids.  Please see the attached handout.  Please utilize sitz bath as discussed in discharge instructions.  Please apply the hydrocortisone/lidocaine combination cream to the hemorrhoids to help with pain, please take these as prescribed.    We have prescribed you new medication(s) today. Discuss the medications prescribed today with your pharmacist as they can have adverse effects and interactions with your other medicines including over the counter and prescribed medications. Seek medical evaluation if you start to experience new or abnormal symptoms after taking one of these medicines, seek care immediately if you start to experience difficulty breathing, feeling of your throat closing, facial swelling, or rash as these could be indications of a more serious allergic reaction  Please follow with primary care within 3 to 5 days for reevaluation.  Return to the ER for new or worsening symptoms or any other concerns.

## 2019-05-23 ENCOUNTER — Telehealth: Payer: Self-pay

## 2019-05-23 NOTE — Telephone Encounter (Signed)
Pt called wanting to be tested for COVID-19. He has no insurance and states that he has some SOB and cough. His employer told him we were testing. Pt was advised to do an E-visit or call the [facility] department. Pt states he will call the health department for assessment of his symptoms.

## 2019-06-29 ENCOUNTER — Other Ambulatory Visit: Payer: Self-pay

## 2019-06-29 DIAGNOSIS — Z20822 Contact with and (suspected) exposure to covid-19: Secondary | ICD-10-CM

## 2019-07-04 LAB — NOVEL CORONAVIRUS, NAA: SARS-CoV-2, NAA: NOT DETECTED

## 2019-07-06 ENCOUNTER — Telehealth: Payer: Self-pay | Admitting: General Practice

## 2019-07-06 NOTE — Telephone Encounter (Signed)
Pt called in and gave him NEG covid results, expressed understanding

## 2019-07-20 ENCOUNTER — Other Ambulatory Visit: Payer: Self-pay

## 2019-07-20 DIAGNOSIS — Z20822 Contact with and (suspected) exposure to covid-19: Secondary | ICD-10-CM

## 2019-07-22 LAB — NOVEL CORONAVIRUS, NAA: SARS-CoV-2, NAA: NOT DETECTED

## 2019-11-23 ENCOUNTER — Other Ambulatory Visit: Payer: Self-pay

## 2019-11-23 DIAGNOSIS — Z20822 Contact with and (suspected) exposure to covid-19: Secondary | ICD-10-CM

## 2019-11-25 LAB — NOVEL CORONAVIRUS, NAA: SARS-CoV-2, NAA: NOT DETECTED

## 2019-12-12 ENCOUNTER — Other Ambulatory Visit: Payer: Self-pay

## 2020-01-31 ENCOUNTER — Other Ambulatory Visit: Payer: Self-pay

## 2020-02-01 ENCOUNTER — Other Ambulatory Visit: Payer: Self-pay

## 2020-02-01 ENCOUNTER — Ambulatory Visit: Payer: Self-pay | Attending: Internal Medicine

## 2020-02-01 DIAGNOSIS — Z20822 Contact with and (suspected) exposure to covid-19: Secondary | ICD-10-CM | POA: Insufficient documentation

## 2020-02-02 LAB — NOVEL CORONAVIRUS, NAA: SARS-CoV-2, NAA: NOT DETECTED

## 2020-09-06 ENCOUNTER — Other Ambulatory Visit: Payer: Self-pay

## 2020-09-06 ENCOUNTER — Encounter (HOSPITAL_COMMUNITY): Payer: Self-pay | Admitting: *Deleted

## 2020-09-06 DIAGNOSIS — Z5321 Procedure and treatment not carried out due to patient leaving prior to being seen by health care provider: Secondary | ICD-10-CM | POA: Insufficient documentation

## 2020-09-06 DIAGNOSIS — G43909 Migraine, unspecified, not intractable, without status migrainosus: Secondary | ICD-10-CM | POA: Insufficient documentation

## 2020-09-06 NOTE — ED Triage Notes (Signed)
Pt with migraine HA since Saturday.  Tried tylenol without relief.  Denies any emesis. + nausea. Sensitive  to light.  Denies hx of migraine HA's

## 2020-09-07 ENCOUNTER — Emergency Department (HOSPITAL_COMMUNITY)
Admission: EM | Admit: 2020-09-07 | Discharge: 2020-09-07 | Disposition: A | Discharge status: Home Health | Payer: Self-pay | Attending: Emergency Medicine | Admitting: Emergency Medicine

## 2021-03-31 ENCOUNTER — Encounter (HOSPITAL_COMMUNITY): Payer: Self-pay | Admitting: Emergency Medicine

## 2021-03-31 ENCOUNTER — Other Ambulatory Visit: Payer: Self-pay

## 2021-03-31 ENCOUNTER — Emergency Department (HOSPITAL_COMMUNITY)
Admission: EM | Admit: 2021-03-31 | Discharge: 2021-03-31 | Disposition: A | Discharge status: Home / Self Care | Payer: Self-pay | Attending: Emergency Medicine | Admitting: Emergency Medicine

## 2021-03-31 DIAGNOSIS — Z20822 Contact with and (suspected) exposure to covid-19: Secondary | ICD-10-CM | POA: Insufficient documentation

## 2021-03-31 DIAGNOSIS — M791 Myalgia, unspecified site: Secondary | ICD-10-CM | POA: Insufficient documentation

## 2021-03-31 DIAGNOSIS — J029 Acute pharyngitis, unspecified: Secondary | ICD-10-CM | POA: Insufficient documentation

## 2021-03-31 LAB — GROUP A STREP BY PCR: Group A Strep by PCR: NOT DETECTED

## 2021-03-31 MED ORDER — ACETAMINOPHEN 500 MG PO TABS
1000.0000 mg | ORAL_TABLET | Freq: Once | ORAL | Status: AC
  Administered 2021-03-31: 1000 mg via ORAL
  Filled 2021-03-31: qty 2

## 2021-03-31 MED ORDER — PREDNISONE 10 MG (21) PO TBPK: ORAL_TABLET | ORAL | 0 refills | Status: DC

## 2021-03-31 NOTE — ED Provider Notes (Signed)
[facility] EMERGENCY DEPARTMENT Provider Note   CSN: [id number] Arrival date & time: 03/31/21  1801     History Chief Complaint  Patient presents with  . Sore Throat    Wayne Reed is a 25 y.o. male.  HPI      Wayne Reed is a 25 y.o. male, with a history of obesity, presenting to the ED with sore throat beginning 2 days ago. Pain is bilateral, aching, nonradiating.  Accompanied by body aches.  Denies fever/chills, cough, shortness of breath, chest pain, dental pain, N/V/D, throat swelling, or any other complaints.    Past Medical History:  Diagnosis Date  . Hemorrhoid     There are no problems to display for this patient.   History reviewed. No pertinent surgical history.     History reviewed. No pertinent family history.  Social History   Tobacco Use  . Smoking status: Never Smoker  . Smokeless tobacco: Never Used  Vaping Use  . Vaping Use: Never used  Substance Use Topics  . Alcohol use: No  . Drug use: No    Home Medications Prior to Admission medications   Medication Sig Start Date End Date Taking? Authorizing Provider  predniSONE (STERAPRED UNI-PAK 21 TAB) 10 MG (21) TBPK tablet Take 6 tabs (60mg ) day 1, 5 tabs (50mg ) day 2, 4 tabs (40mg ) day 3, 3 tabs (30mg ) day 4, 2 tabs (20mg ) day 5, and 1 tab (10mg ) day 6. 03/31/21  Yes Sekai Gitlin C, PA-C  lidocaine-hydrocortisone (ANAMANTEL HC) 3-0.5 % CREA Place 1 Applicatorful rectally 2 (two) times daily. 12/28/18   Petrucelli, , PA-C  oseltamivir (TAMIFLU) 75 MG capsule Take 1 capsule (75 mg total) by mouth every 12 (twelve) hours. Patient not taking: Reported on 04/03/2021 12/13/18   , PA-C    Allergies    Patient has no known allergies.  Review of Systems   Review of Systems  Constitutional: Negative for chills, diaphoresis and fever.  HENT: Positive for sore throat. Negative for drooling, facial swelling, trouble swallowing and voice change.   Respiratory:  Negative for cough and shortness of breath.   Cardiovascular: Negative for chest pain.  Gastrointestinal: Negative for abdominal pain, diarrhea, nausea and vomiting.  Musculoskeletal: Negative for neck pain and neck stiffness.  Neurological: Negative for dizziness, syncope and headaches.  All other systems reviewed and are negative.   Physical Exam Updated Vital Signs BP 132/73 (BP Location: Right Arm)   Pulse 97   Temp 98.9 F (37.2 C) (Oral)   Resp 17   Ht 6\' 6"  (1.981 m)   Wt (!) 139.2 kg   SpO2 100%   BMI 35.45 kg/m   Physical Exam Vitals and nursing note reviewed.  Constitutional:      General: [name] not in acute distress.    Appearance: [name] well-developed. [name] not diaphoretic.  HENT:     Head: Normocephalic and atraumatic.     Nose: Nose normal.     Mouth/Throat:     Mouth: Mucous membranes are moist.     Pharynx: Oropharynx is clear.     Comments: Dentition appears to be intact and stable.  No noted area of intraoral swelling or fluctuance.  No trismus or noted abnormal phonation.  Mouth opening to at least 3 finger widths.  Handles oral secretions without difficulty.  No noted facial swelling.  No sublingual swelling or tongue elevation.  No swelling or tenderness to the submental or submandibular regions.  No swelling  or tenderness into the soft tissues of the neck. Eyes:     Conjunctiva/sclera: Conjunctivae normal.  Cardiovascular:     Rate and Rhythm: Normal rate and regular rhythm.     Pulses: Normal pulses.     Heart sounds: Normal heart sounds.  Pulmonary:     Effort: Pulmonary effort is normal. No respiratory distress.     Breath sounds: Normal breath sounds.  Abdominal:     Tenderness: There is no guarding.  Musculoskeletal:     Cervical back: Normal range of motion and neck supple.  Lymphadenopathy:     Cervical: No cervical adenopathy.  Skin:    General: Skin is warm and dry.  Neurological:     Mental Status: [name] alert.  Psychiatric:         Mood and Affect: Mood and affect normal.        Speech: Speech normal.        Behavior: Behavior normal.     ED Results / Procedures / Treatments   Labs (all labs ordered are listed, but only abnormal results are displayed) Labs Reviewed  GROUP A STREP BY PCR  SARS CORONAVIRUS 2 (TAT 6-24 HRS)    EKG None  Radiology No results found.  Procedures Procedures   Medications Ordered in ED Medications  acetaminophen (TYLENOL) tablet 1,000 mg (1,000 mg Oral Given 03/31/21 2228)    ED Course  I have reviewed the triage vital signs and the nursing notes.  Pertinent labs & imaging results that were available during my care of the patient were reviewed by me and considered in my medical decision making (see chart for details).    MDM Rules/Calculators/A&P                          Patient presents with sore throat and body aches.   Patient is nontoxic appearing, afebrile, not tachycardic, not tachypneic, not hypotensive, maintains excellent SPO2 on room air, and is in no apparent distress.  Strep negative. Covid test pending. The patient was given instructions for home care as well as return precautions. Patient voices understanding of these instructions, accepts the plan, and is comfortable with discharge.  Vitals:   03/31/21 1939 03/31/21 1940 03/31/21 2228  BP: 132/73  128/65  Pulse: 97  82  Resp: 17  18  Temp: 98.9 F (37.2 C)  98.7 F (37.1 C)  TempSrc: Oral  Oral  SpO2: 100%  100%  Weight:  (!) 139.2 kg   Height:  6\' 6"  (1.981 m)      Final Clinical Impression(s) / ED Diagnoses Final diagnoses:  Sore throat    Rx / DC Orders ED Discharge Orders         Ordered    predniSONE (STERAPRED UNI-PAK 21 TAB) 10 MG (21) TBPK tablet        03/31/21 2227           2228, PA-C 04/03/21 1127    04/05/21, MD 04/03/21 [phone]

## 2021-03-31 NOTE — ED Triage Notes (Signed)
Pt c/o a sore throat since Saturday morning.

## 2021-03-31 NOTE — Discharge Instructions (Addendum)
General Viral Syndrome Care Instructions:  Your strep test was negative.  Your symptoms are likely consistent with a viral illness. Viruses do not require or respond to antibiotics. Treatment is symptomatic care and it is important to note that these symptoms may last for 7-14 days.   Hand washing: Wash your hands throughout the day, but especially before and after touching the face, using the restroom, sneezing, coughing, or touching surfaces that have been coughed or sneezed upon. Hydration: Symptoms of most illnesses will be intensified and complicated by dehydration. Dehydration can also extend the duration of symptoms. Drink plenty of fluids and get plenty of rest. You should be drinking at least half a liter of water an hour to stay hydrated. Electrolyte drinks (ex. Gatorade, Powerade, Pedialyte) are also encouraged. You should be drinking enough fluids to make your urine light yellow, almost clear. If this is not the case, you are not drinking enough water. Please note that some of the treatments indicated below will not be effective if you are not adequately hydrated. Diet: Please concentrate on hydration, however, you may introduce food slowly.  Start with a clear liquid diet, progressed to a full liquid diet, and then bland solids as you are able. Pain or fever: Ibuprofen, Naproxen, or acetaminophen (generic for Tylenol) for pain or fever.  Antiinflammatory medications: Take 600 mg of ibuprofen every 6 hours or 440 mg (over the counter dose) to 500 mg (prescription dose) of naproxen every 12 hours for the next 3 days. After this time, these medications may be used as needed for pain. Take these medications with food to avoid upset stomach. Choose only one of these medications, do not take them together. Acetaminophen (generic for Tylenol): Should you continue to have additional pain while taking the ibuprofen or naproxen, you may add in acetaminophen as needed. Your daily total maximum amount of  acetaminophen from all sources should be limited to 4000mg /day for persons without liver problems, or 2000mg /day for those with liver problems. Prednisone: Take the prednisone, as directed, in its entirety. Zyrtec or Claritin: May add these medication daily to control underlying symptoms of congestion, sneezing, and other signs of allergies.  These medications are available over-the-counter. Generics: Cetirizine (generic for Zyrtec) and loratadine (generic for Claritin). Fluticasone: Use fluticasone (generic for Flonase), as directed, for nasal and sinus congestion.  This medication is available over-the-counter. Congestion: Plain guaifenesin (generic for plain Mucinex) may help relieve congestion. Saline sinus rinses and saline nasal sprays may also help relieve congestion. If you do not have high blood pressure, heart problems, or an allergy to such medications, you may also try phenylephrine or Sudafed. Sore throat: Warm liquids or Chloraseptic spray may help soothe a sore throat. Gargle twice a day with a salt water solution made from a half teaspoon of salt in a cup of warm water.  Follow up: Follow up with a primary care provider within the next two weeks should symptoms fail to resolve. Return: Return to the ED for significantly worsening symptoms, shortness of breath, persistent vomiting, large amounts of blood in stool, or any other major concerns.  For prescription assistance, may try using prescription discount sites or apps, such as goodrx.com  Test Results for COVID-19 pending  You have a test pending for COVID-19.  Results typically return within about 48 hours.  Be sure to check MyChart for updated results.  We recommend isolating yourself until results are received.  Patients who have symptoms consistent with COVID-19 should self isolated for: At  least 3 days (72 hours) have passed since recovery, defined as resolution of fever without the use of fever reducing medications and  improvement in respiratory symptoms (e.g., cough, shortness of breath), and At least 7 days have passed since symptoms first appeared.  If you have no symptoms, but your test returns positive, recommend isolating for at least 10 days.

## 2021-04-01 LAB — SARS CORONAVIRUS 2 (TAT 6-24 HRS): SARS Coronavirus 2: NEGATIVE

## 2021-04-03 ENCOUNTER — Encounter (HOSPITAL_COMMUNITY): Payer: Self-pay | Admitting: Emergency Medicine

## 2021-04-03 ENCOUNTER — Emergency Department (HOSPITAL_COMMUNITY)
Admission: EM | Admit: 2021-04-03 | Discharge: 2021-04-03 | Disposition: A | Discharge status: Home / Self Care | Payer: Self-pay | Attending: Emergency Medicine | Admitting: Emergency Medicine

## 2021-04-03 ENCOUNTER — Other Ambulatory Visit: Payer: Self-pay

## 2021-04-03 DIAGNOSIS — R0989 Other specified symptoms and signs involving the circulatory and respiratory systems: Secondary | ICD-10-CM

## 2021-04-03 DIAGNOSIS — T17290A Other foreign object in pharynx causing asphyxiation, initial encounter: Secondary | ICD-10-CM | POA: Insufficient documentation

## 2021-04-03 DIAGNOSIS — X58XXXA Exposure to other specified factors, initial encounter: Secondary | ICD-10-CM | POA: Insufficient documentation

## 2021-04-03 MED ORDER — GLUCAGON HCL RDNA (DIAGNOSTIC) 1 MG IJ SOLR
1.0000 mg | Freq: Once | INTRAMUSCULAR | Status: AC
  Administered 2021-04-03: 1 mg via INTRAVENOUS
  Filled 2021-04-03: qty 1

## 2021-04-03 NOTE — ED Triage Notes (Signed)
Pt feels like he has a small pill stuck in his throat that he took at 0800 this morning.

## 2021-04-03 NOTE — Discharge Instructions (Addendum)
As discussed, drink fluids and soft foods for at least 24 to 48 hours.  Return to the emergency department if you develop any new or worsening symptoms.

## 2021-04-03 NOTE — ED Notes (Signed)
Fluids given to pt. Pt tolerated well.

## 2021-04-04 NOTE — ED Provider Notes (Signed)
[facility] EMERGENCY DEPARTMENT Provider Note   CSN: [id number] Arrival date & time: 04/03/21  1022     History No chief complaint on file.   Wayne Reed is a 25 y.o. male.  HPI      Wayne Reed is a 25 y.o. male who presents to the Emergency Department complaining of foreign body sensation in his throat.  He states that he swallowed a prednisone tablet at 8 AM prior to arrival.  He states that he feels like the pill is stuck in his throat.  He has drank water and ate a pop tart prior to arrival with no relief.  He denies history of prior problems with swallowing.  He also denies shortness of breath or pain in his chest.  No vomiting.  Nothing makes his symptoms better or worse.  Past Medical History:  Diagnosis Date  . Hemorrhoid     There are no problems to display for this patient.   History reviewed. No pertinent surgical history.     No family history on file.  Social History   Tobacco Use  . Smoking status: Never Smoker  . Smokeless tobacco: Never Used  Vaping Use  . Vaping Use: Never used  Substance Use Topics  . Alcohol use: No  . Drug use: No    Home Medications Prior to Admission medications   Medication Sig Start Date End Date Taking? Authorizing Provider  lidocaine-hydrocortisone (ANAMANTEL HC) 3-0.5 % CREA Place 1 Applicatorful rectally 2 (two) times daily. 12/28/18   Petrucelli, Pleas Koch, PA-C  oseltamivir (TAMIFLU) 75 MG capsule Take 1 capsule (75 mg total) by mouth every 12 (twelve) hours. Patient not taking: Reported on 04/03/2021 12/13/18   Ivery Quale, PA-C  predniSONE (STERAPRED UNI-PAK 21 TAB) 10 MG (21) TBPK tablet Take 6 tabs (60mg ) day 1, 5 tabs (50mg ) day 2, 4 tabs (40mg ) day 3, 3 tabs (30mg ) day 4, 2 tabs (20mg ) day 5, and 1 tab (10mg ) day 6. 03/31/21   Joy, Shawn C, PA-C    Allergies    Patient has no known allergies.  Review of Systems   Review of Systems  Constitutional: Negative for chills, fatigue and fever.   HENT: Positive for trouble swallowing. Negative for congestion, rhinorrhea and sore throat.   Respiratory: Negative for cough, shortness of breath and wheezing.   Cardiovascular: Negative for chest pain and palpitations.  Gastrointestinal: Negative for abdominal pain, nausea and vomiting.  Musculoskeletal: Negative for neck pain and neck stiffness.  Skin: Negative for wound.  Neurological: Negative for dizziness, weakness and numbness.  Hematological: Does not bruise/bleed easily.    Physical Exam Updated Vital Signs BP (!) 143/92   Pulse 80   Temp 98.7 F (37.1 C) (Oral)   Resp 18   SpO2 99%   Physical Exam Vitals and nursing note reviewed.  Constitutional:      General: [name] not in acute distress.    Appearance: Normal appearance. [name] not ill-appearing.  HENT:     Mouth/Throat:     Mouth: Mucous membranes are moist.     Pharynx: Oropharynx is clear. Uvula midline. No pharyngeal swelling, oropharyngeal exudate, posterior oropharyngeal erythema or uvula swelling.     Comments: No foreign body seen. Cardiovascular:     Rate and Rhythm: Normal rate and regular rhythm.     Pulses: Normal pulses.  Pulmonary:     Effort: Pulmonary effort is normal. No respiratory distress.     Breath sounds: Normal breath sounds.  No stridor. No rhonchi.     Comments: Lungs clear to auscultation bilaterally Abdominal:     Palpations: Abdomen is soft.     Tenderness: There is no abdominal tenderness.  Musculoskeletal:        General: Normal range of motion.     Cervical back: Normal range of motion. No tenderness.  Skin:    General: Skin is warm.     Findings: No erythema.  Neurological:     General: No focal deficit present.     Mental Status: [name] alert.     Sensory: No sensory deficit.     Motor: No weakness.     ED Results / Procedures / Treatments   Labs (all labs ordered are listed, but only abnormal results are displayed) Labs Reviewed - No data to  display  EKG None  Radiology No results found.  Procedures Procedures   Medications Ordered in ED Medications  glucagon (human recombinant) (GLUCAGEN) injection 1 mg (1 mg Intravenous Given 04/03/21 1207)    ED Course  I have reviewed the triage vital signs and the nursing notes.  Pertinent labs & imaging results that were available during my care of the patient were reviewed by me and considered in my medical decision making (see chart for details).    MDM Rules/Calculators/A&P                          Patient here with foreign body sensation of the throat.  No respiratory distress.  No wheezing or stridor.  Patient speaking in complete sentences without respiratory distress.  No visualized foreign body in the oropharynx.  Will try oral fluid challenge.  Patient tolerating oral fluids without difficulty.  No vomiting.  Given IV glucagon and will observe.  On recheck, patient resting comfortably.  No distress.  States that his symptoms have improved and states that he is ready for discharge home.  Likely esophageal irritation.  Feel patient is appropriate for discharge home, he was given strict return precautions.  Agreeable to plan.  The patient appears reasonably screened and/or stabilized for discharge and I doubt any other medical condition or other [facility] requiring further screening, evaluation, or treatment in the ED at this time prior to discharge.   Final Clinical Impression(s) / ED Diagnoses Final diagnoses:  Foreign body sensation in throat    Rx / DC Orders ED Discharge Orders    None       Pauline Aus, PA-C 04/04/21 1201    Cathren Laine, MD 04/04/21 1712

## 2022-04-23 ENCOUNTER — Ambulatory Visit
Admission: EM | Admit: 2022-04-23 | Discharge: 2022-04-23 | Disposition: A | Discharge status: Home / Self Care | Payer: Managed Care, Other (non HMO) | Attending: Nurse Practitioner | Admitting: Nurse Practitioner

## 2022-04-23 ENCOUNTER — Encounter: Payer: Self-pay | Admitting: Emergency Medicine

## 2022-04-23 DIAGNOSIS — J020 Streptococcal pharyngitis: Secondary | ICD-10-CM

## 2022-04-23 LAB — POCT RAPID STREP A (OFFICE): Rapid Strep A Screen: POSITIVE — AB

## 2022-04-23 MED ORDER — PENICILLIN V POTASSIUM 500 MG PO TABS: 500.0000 mg | ORAL_TABLET | Freq: Three times a day (TID) | ORAL | 0 refills | Status: AC

## 2022-04-23 MED ORDER — IBUPROFEN 800 MG PO TABS: 800.0000 mg | ORAL_TABLET | Freq: Three times a day (TID) | ORAL | 0 refills | Status: DC | PRN

## 2022-04-23 MED ORDER — LIDOCAINE VISCOUS HCL 2 % MT SOLN: 15.0000 mL | Freq: Four times a day (QID) | OROMUCOSAL | 0 refills | Status: DC | PRN

## 2022-04-23 NOTE — Discharge Instructions (Addendum)
Your strep test is positive today. ?Take medication as prescribed. ?Increase fluids and get plenty of rest. ?Recommend a soft diet until your symptoms improve to include soups, broths, puddings, yogurt, warm or cool liquids. ?Change her toothbrush after 3 days. ?Warm salt water gargles 3-4 times daily until symptoms improve. ?Follow-up if symptoms worsen or do not improve. ? ?

## 2022-04-23 NOTE — ED Provider Notes (Signed)
?[facility] ? ? ? ?CSN: [id number] ?Arrival date & time: 04/23/22  1141 ? ? ?  ? ?History   ?Chief Complaint ?No chief complaint on file. ? ? ?HPI ?Wayne Reed is a 26 y.o. male.  ? ?The Wayne Reed is a 26 year old male who presents with sore throat.  Symptoms have been present for the past 3 days.  He states that he recently arrived back from [place] and traveled via plane.  He states over the last 24 hours, his symptoms have worsened, he complains of fatigue, worsening sore throat, difficulty swallowing, and body aches.  Wayne Reed states he has been taking DayQuil and has used ibuprofen for his symptoms without minimal relief.  He denies fever, chills, headache, ear pain, nausea, vomiting.  He states that he has had some diarrhea over the past 24 hours. ? ?The history is provided by the Wayne Reed.  ? ?Past Medical History:  ?Diagnosis Date  ? Hemorrhoid   ? ? ?There are no problems to display for this Wayne Reed. ? ? ?History reviewed. No pertinent surgical history. ? ? ? ? ?Home Medications   ? ?Prior to Admission medications   ?Medication Sig Start Date End Date Taking? Authorizing Provider  ?ibuprofen (ADVIL) 800 MG tablet Take 1 tablet (800 mg total) by mouth every 8 (eight) hours as needed. 04/23/22  Yes Issabelle Mcraney-Warren, Alda Lea, NP  ?lidocaine (XYLOCAINE) 2 % solution Use as directed 15 mLs in the mouth or throat every 6 (six) hours as needed for mouth pain. 04/23/22  Yes Octaviano Mukai-Warren, Alda Lea, NP  ?penicillin v potassium (VEETID) 500 MG tablet Take 1 tablet (500 mg total) by mouth 3 (three) times daily for 10 days. 04/23/22 05/03/22 Yes Lareina Espino-Warren, Alda Lea, NP  ?lidocaine-hydrocortisone (ANAMANTEL HC) 3-0.5 % CREA Place 1 Applicatorful rectally 2 (two) times daily. 12/28/18   Petrucelli, Samantha R, PA-C  ?oseltamivir (TAMIFLU) 75 MG capsule Take 1 capsule (75 mg total) by mouth every 12 (twelve) hours. ?Wayne Reed not taking: Reported on 04/03/2021 12/13/18   Lily Kocher, PA-C  ?predniSONE (STERAPRED  UNI-PAK 21 TAB) 10 MG (21) TBPK tablet Take 6 tabs (60mg ) day 1, 5 tabs (50mg ) day 2, 4 tabs (40mg ) day 3, 3 tabs (30mg ) day 4, 2 tabs (20mg ) day 5, and 1 tab (10mg ) day 6. 03/31/21   Joy, Shawn C, PA-C  ? ? ?Family History ?History reviewed. No pertinent family history. ? ?Social History ?Social History  ? ?Tobacco Use  ? Smoking status: Never  ? Smokeless tobacco: Never  ?Vaping Use  ? Vaping Use: Never used  ?Substance Use Topics  ? Alcohol use: Not Currently  ? Drug use: No  ? ? ? ?Allergies   ?Wayne Reed has no known allergies. ? ? ?Review of Systems ?Review of Systems  ?Constitutional:  Positive for activity change, appetite change and fatigue. Negative for fever.  ?HENT:  Positive for sore throat and trouble swallowing. Negative for congestion and ear pain.   ?Eyes: Negative.   ?Respiratory:  Positive for cough. Negative for shortness of breath and wheezing.   ?Cardiovascular: Negative.   ?Gastrointestinal: Negative.   ?Skin: Negative.   ?Psychiatric/Behavioral: Negative.    ? ? ?Physical Exam ?Triage Vital Signs ?ED Triage Vitals  ?Enc Vitals Group  ?   BP 04/23/22 1214 135/87  ?   Pulse Rate 04/23/22 1214 (!) 102  ?   Resp 04/23/22 1214 18  ?   Temp 04/23/22 1214 99.1 ?F (37.3 ?C)  ?   Temp Source 04/23/22 1214  Oral  ?   SpO2 04/23/22 1214 97 %  ?   Weight --   ?   Height --   ?   Head Circumference --   ?   Peak Flow --   ?   Pain Score 04/23/22 1215 10  ?   Pain Loc --   ?   Pain Edu? --   ?   Excl. in [place]? --   ? ?No data found. ? ?Updated Vital Signs ?BP 135/87 (BP Location: Right Arm)   Pulse (!) 102   Temp 99.1 ?F (37.3 ?C) (Oral)   Resp 18   SpO2 97%  ? ?Visual Acuity ?Right Eye Distance:   ?Left Eye Distance:   ?Bilateral Distance:   ? ?Right Eye Near:   ?Left Eye Near:    ?Bilateral Near:    ? ?Physical Exam ?Vitals reviewed.  ?Constitutional:   ?   Appearance: Normal appearance.  ?HENT:  ?   Head: Normocephalic.  ?   Right Ear: Tympanic membrane, ear canal and external ear normal.  ?   Left Ear:  Tympanic membrane, ear canal and external ear normal.  ?   Nose: Nose normal.  ?   Mouth/Throat:  ?   Mouth: Mucous membranes are moist.  ?   Pharynx: Posterior oropharyngeal erythema present. No oropharyngeal exudate.  ?Eyes:  ?   Extraocular Movements: Extraocular movements intact.  ?   Conjunctiva/sclera: Conjunctivae normal.  ?   Pupils: Pupils are equal, round, and reactive to light.  ?Cardiovascular:  ?   Rate and Rhythm: Regular rhythm. Tachycardia present.  ?   Pulses: Normal pulses.  ?   Heart sounds: Normal heart sounds.  ?Pulmonary:  ?   Effort: Pulmonary effort is normal. No respiratory distress.  ?   Breath sounds: Normal breath sounds. No wheezing or rales.  ?Abdominal:  ?   General: Bowel sounds are normal.  ?   Palpations: Abdomen is soft.  ?   Tenderness: There is no abdominal tenderness.  ?Musculoskeletal:  ?   Cervical back: Normal range of motion.  ?Lymphadenopathy:  ?   Cervical: No cervical adenopathy.  ?Skin: ?   General: Skin is warm and dry.  ?   Capillary Refill: Capillary refill takes less than 2 seconds.  ?Neurological:  ?   General: No focal deficit present.  ?   Mental Status: [name] alert and oriented to person, place, and time.  ?Psychiatric:     ?   Mood and Affect: Mood normal.     ?   Behavior: Behavior normal.  ? ? ? ?UC Treatments / Results  ?Labs ?(all labs ordered are listed, but only abnormal results are displayed) ?Labs Reviewed  ?POCT RAPID STREP A (OFFICE) - Abnormal; Notable for the following components:  ?    Result Value  ? Rapid Strep A Screen Positive (*)   ? All other components within normal limits  ? ? ?EKG ? ? ?Radiology ?No results found. ? ?Procedures ?Procedures (including critical care time) ? ?Medications Ordered in UC ?Medications - No data to display ? ?Initial Impression / Assessment and Plan / UC Course  ?I have reviewed the triage vital signs and the nursing notes. ? ?Pertinent labs & imaging results that were available during my care of the Wayne Reed were  reviewed by me and considered in my medical decision making (see chart for details). ? ?The Wayne Reed is a 26 year old male who presents for sore throat symptoms.  Symptoms started  approximately 3 days ago.  Wayne Reed presents with +2 tonsil swelling bilaterally, no exudate is seen on exam, he does have cervical adenopathy, his strep test is positive today.  We will start the Wayne Reed on penicillin.  We will also provide viscous lidocaine and ibuprofen to help with his pain and inflammation.  Wayne Reed recommended to provide supportive care at home to include increasing rest, getting plenty of fluids, warm salt water gargles, and a soft diet until symptoms improve.  Wayne Reed advised to change his toothbrush after 3 days.  Wayne Reed advised to follow-up as needed. ?Final Clinical Impressions(s) / UC Diagnoses  ? ?Final diagnoses:  ?Streptococcal sore throat  ? ? ? ?Discharge Instructions   ? ?  ?Your strep test is positive today. ?Take medication as prescribed. ?Increase fluids and get plenty of rest. ?Recommend a soft diet until your symptoms improve to include soups, broths, puddings, yogurt, warm or cool liquids. ?Change her toothbrush after 3 days. ?Warm salt water gargles 3-4 times daily until symptoms improve. ?Follow-up if symptoms worsen or do not improve. ? ? ? ? ? ?ED Prescriptions   ? ? Medication Sig Dispense Auth. Provider  ? penicillin v potassium (VEETID) 500 MG tablet Take 1 tablet (500 mg total) by mouth 3 (three) times daily for 10 days. 30 tablet Khaliel Morey-Warren, Alda Lea, NP  ? lidocaine (XYLOCAINE) 2 % solution Use as directed 15 mLs in the mouth or throat every 6 (six) hours as needed for mouth pain. 100 mL Burl Tauzin-Warren, Alda Lea, NP  ? ibuprofen (ADVIL) 800 MG tablet Take 1 tablet (800 mg total) by mouth every 8 (eight) hours as needed. 20 tablet Veneta Sliter-Warren, Alda Lea, NP  ? ?  ? ?PDMP not reviewed this encounter. ?  ?Tish Men, NP ?04/23/22 1341 ? ?

## 2022-04-23 NOTE — ED Triage Notes (Signed)
Sore throat since Saturday.  Started to feel fatigued on Monday.  Has tried dayquil to help with symptoms without relief.  States having body aches. ?

## 2022-04-26 ENCOUNTER — Ambulatory Visit
Admission: EM | Admit: 2022-04-26 | Discharge: 2022-04-26 | Disposition: A | Discharge status: Home / Self Care | Payer: Managed Care, Other (non HMO) | Attending: Family Medicine | Admitting: Family Medicine

## 2022-04-26 ENCOUNTER — Other Ambulatory Visit: Payer: Self-pay

## 2022-04-26 ENCOUNTER — Emergency Department (HOSPITAL_COMMUNITY)
Admission: EM | Admit: 2022-04-26 | Discharge: 2022-04-26 | Disposition: A | Discharge status: Home / Self Care | Payer: Managed Care, Other (non HMO) | Attending: Emergency Medicine | Admitting: Emergency Medicine

## 2022-04-26 ENCOUNTER — Encounter (HOSPITAL_COMMUNITY): Payer: Self-pay

## 2022-04-26 DIAGNOSIS — Z20828 Contact with and (suspected) exposure to other viral communicable diseases: Secondary | ICD-10-CM | POA: Diagnosis not present

## 2022-04-26 DIAGNOSIS — J020 Streptococcal pharyngitis: Secondary | ICD-10-CM

## 2022-04-26 DIAGNOSIS — R531 Weakness: Secondary | ICD-10-CM | POA: Insufficient documentation

## 2022-04-26 DIAGNOSIS — R5383 Other fatigue: Secondary | ICD-10-CM | POA: Diagnosis not present

## 2022-04-26 LAB — POCT MONO SCREEN (KUC): Mono, POC: NEGATIVE

## 2022-04-26 MED ORDER — PENICILLIN G BENZATHINE 1200000 UNIT/2ML IM SUSY
1.2000 10*6.[IU] | PREFILLED_SYRINGE | Freq: Once | INTRAMUSCULAR | Status: AC
  Administered 2022-04-26: 1.2 10*6.[IU] via INTRAMUSCULAR
  Filled 2022-04-26: qty 2

## 2022-04-26 MED ORDER — KETOROLAC TROMETHAMINE 30 MG/ML IJ SOLN
30.0000 mg | Freq: Once | INTRAMUSCULAR | Status: AC
  Administered 2022-04-26: 30 mg via INTRAVENOUS
  Filled 2022-04-26: qty 1

## 2022-04-26 MED ORDER — SODIUM CHLORIDE 0.9 % IV BOLUS
1000.0000 mL | Freq: Once | INTRAVENOUS | Status: AC
  Administered 2022-04-26: 1000 mL via INTRAVENOUS

## 2022-04-26 MED ORDER — ONDANSETRON 4 MG PO TBDP: 4.0000 mg | ORAL_TABLET | Freq: Three times a day (TID) | ORAL | 0 refills | Status: DC | PRN

## 2022-04-26 NOTE — ED Notes (Signed)
Pt states he is feeling better, he is currently eating a chicken sandwich. ?

## 2022-04-26 NOTE — ED Triage Notes (Signed)
Pt got dx with strep throat on Tues and went to urgent care. Pt states he still feels tired and weak. He states his throat feels better but his overall body does not. Pt was tested for covid, flu  and mono today and urgent care. Pt feels he is dehydrated.  ?

## 2022-04-26 NOTE — ED Provider Notes (Signed)
?[facility] ?Provider Note ? ? ?CSN: [id number] ?Arrival date & time: 04/26/22  1614 ? ?  ? ?History ? ?Chief Complaint  ?Patient presents with  ? Fatigue  ? ? ?Wayne Reed is a 26 y.o. male. ? ?HPI ? ?  ? ?Wayne Reed is a 26 y.o. male who presents to the Emergency Department complaining of generalized body aches and fatigue.  He was seen at urgent care earlier this week and diagnosed with strep throat.  He was given prescription for penicillin which she has been taking 3 times a day without relief.  He feels he has some improvement of his throat symptoms, but continues to feel achy and having generalized weakness.  He returned to urgent care and had a negative monotest today, his COVID and flu tests are pending from urgent care and he was advised to come to the emergency department for possible dehydration.  He did not have significant cough, chest pain or shortness of breath.  No abdominal pain vomiting or diarrhea. ? ? ? ?Home Medications ?Prior to Admission medications   ?Medication Sig Start Date End Date Taking? Authorizing Provider  ?ibuprofen (ADVIL) 800 MG tablet Take 1 tablet (800 mg total) by mouth every 8 (eight) hours as needed. 04/23/22   Leath-Warren, Sadie Haber, NP  ?lidocaine (XYLOCAINE) 2 % solution Use as directed 15 mLs in the mouth or throat every 6 (six) hours as needed for mouth pain. 04/23/22   Leath-Warren, Sadie Haber, NP  ?lidocaine-hydrocortisone (ANAMANTEL HC) 3-0.5 % CREA Place 1 Applicatorful rectally 2 (two) times daily. 12/28/18   Petrucelli, Samantha R, PA-C  ?ondansetron (ZOFRAN-ODT) 4 MG disintegrating tablet Take 1 tablet (4 mg total) by mouth every 8 (eight) hours as needed for nausea or vomiting. 04/26/22   Mardella Layman, MD  ?oseltamivir (TAMIFLU) 75 MG capsule Take 1 capsule (75 mg total) by mouth every 12 (twelve) hours. ?Patient not taking: Reported on 04/03/2021 12/13/18   Ivery Quale, PA-C  ?penicillin v potassium (VEETID) 500 MG tablet Take 1  tablet (500 mg total) by mouth 3 (three) times daily for 10 days. 04/23/22 05/03/22  Leath-Warren, Sadie Haber, NP  ?predniSONE (STERAPRED UNI-PAK 21 TAB) 10 MG (21) TBPK tablet Take 6 tabs (60mg ) day 1, 5 tabs (50mg ) day 2, 4 tabs (40mg ) day 3, 3 tabs (30mg ) day 4, 2 tabs (20mg ) day 5, and 1 tab (10mg ) day 6. 03/31/21   Joy, Shawn C, PA-C  ?   ? ?Allergies    ?Patient has no known allergies.   ? ?Review of Systems   ?Review of Systems  ?Constitutional:  Positive for chills and fatigue. Negative for fever.  ?HENT:  Positive for sore throat.   ?Respiratory:  Negative for chest tightness and shortness of breath.   ?Cardiovascular:  Negative for chest pain.  ?Gastrointestinal:  Negative for abdominal pain, diarrhea, nausea and vomiting.  ?Musculoskeletal:  Positive for myalgias. Negative for back pain, neck pain and neck stiffness.  ?Skin:  Negative for rash.  ?Neurological:  Positive for weakness. Negative for syncope, numbness and headaches.  ? ?Physical Exam ?Updated Vital Signs ?BP (!) 146/92 (BP Location: Right Arm)   Pulse (!) 101   Temp 98.6 ?F (37 ?C) (Oral)   Resp 19   Ht 6\' 5"  (1.956 m)   Wt (!) 145.2 kg   SpO2 99%   BMI 37.96 kg/m?  ?Physical Exam ?Vitals and nursing note reviewed.  ?Constitutional:   ?   General: [name] not  in acute distress. ?   Appearance: Normal appearance. [name] not toxic-appearing.  ?HENT:  ?   Right Ear: Tympanic membrane and ear canal normal.  ?   Left Ear: Tympanic membrane and ear canal normal.  ?   Mouth/Throat:  ?   Mouth: Mucous membranes are dry.  ?   Pharynx: No oropharyngeal exudate or posterior oropharyngeal erythema.  ?   Comments: Some mild erythema of the oropharynx without edema or exudate.  Uvula midline nonedematous.  No trismus.  No bulging of the soft palate. ?Cardiovascular:  ?   Rate and Rhythm: Regular rhythm. Tachycardia present.  ?   Pulses: Normal pulses.  ?Pulmonary:  ?   Effort: Pulmonary effort is normal. No respiratory distress.  ?Abdominal:  ?    Palpations: Abdomen is soft.  ?   Tenderness: There is no abdominal tenderness.  ?Musculoskeletal:     ?   General: Normal range of motion.  ?   Cervical back: No rigidity.  ?Lymphadenopathy:  ?   Cervical: No cervical adenopathy.  ?Skin: ?   General: Skin is warm.  ?   Capillary Refill: Capillary refill takes less than 2 seconds.  ?   Findings: No rash.  ?Neurological:  ?   General: No focal deficit present.  ?   Mental Status: [name] alert.  ?   Sensory: No sensory deficit.  ?   Motor: No weakness.  ? ? ?ED Results / Procedures / Treatments   ?Labs ?(all labs ordered are listed, but only abnormal results are displayed) ?Labs Reviewed - No data to display ? ?EKG ?None ? ?Radiology ?No results found. ? ?Procedures ?Procedures  ? ? ?Medications Ordered in ED ?Medications  ?sodium chloride 0.9 % bolus 1,000 mL (0 mLs Intravenous Stopped 04/26/22 1938)  ?penicillin g benzathine (BICILLIN LA) 1200000 UNIT/2ML injection 1.2 Million Units (1.2 Million Units Intramuscular Given 04/26/22 1835)  ?ketorolac (TORADOL) 30 MG/ML injection 30 mg (30 mg Intravenous Given 04/26/22 1834)  ? ? ?ED Course/ Medical Decision Making/ A&P ?  ?                        ?Medical Decision Making ?Amount and/or Complexity of Data Reviewed ?External Data Reviewed: labs. ?   Details: Patient had positive strep test documented on 04/23/2022.  He was seen monotest from today negative.  COVID and influenza testing obtained earlier today at urgent care results are pending. ?Labs: ordered. ? ?Risk ?Prescription drug management. ? ? ?Patient here for evaluation of generalized body aches and weakness.  Symptoms times several days.  Recently seen at urgent care and had positive strep test.  Had COVID and flu tests today and results are pending, monotest at urgent care was negative.  Recommended to come to the emergency department for further evaluation of dehydration. ? ?On exam, patient well-appearing nontoxic.  Mildly tachycardic, his mucous membranes are dry  he continues to have some erythema of the oropharynx without evidence of PTA.  Handles his secretions well. ? ?Will administer IV fluids, IV Toradol and he is agreeable to IM Bicillin. ? ?On recheck, patient reports feeling better and states he is ready for discharge home.  I suspect there was some component of mild dehydration.  He does not appear septic.  I feel that he is appropriate for discharge home.  He will continue his penicillin as prescribed. ? ? ? ? ? ? ? ?Final Clinical Impression(s) / ED Diagnoses ?Final diagnoses:  ?Generalized  weakness  ?Pharyngitis due to Streptococcus species  ? ? ?Rx / DC Orders ?ED Discharge Orders   ? ? None  ? ?  ? ? ?  ?Jupiter Kabir, Pauline Aus-C ?04/26/22 2020 ? ?  ?Bethann BerkshireZammit, Joseph, MD ?04/27/22 1246 ? ?

## 2022-04-26 NOTE — Discharge Instructions (Signed)
Please do your best to ensure adequate fluid intake in order to avoid dehydration. If you find that you are unable to tolerate drinking fluids regularly please proceed to the Emergency Department for evaluation. ° ° °

## 2022-04-26 NOTE — ED Triage Notes (Signed)
Pt states he came in 3 days ago and was diagnosed with Strep ? ?Pt states he is still taking his antibiotics but he is still very fatigue ? ?Pt states his body is aching and he feels so weak ?

## 2022-04-26 NOTE — Discharge Instructions (Signed)
You may take Tylenol if needed for headache this evening.  Continue taking your antibiotics as directed until they are finished.  Is important that you drink plenty of water and stay hydrated.  Follow-up with your primary care provider for recheck return to the emergency department for any new or worsening symptoms. ?

## 2022-04-28 LAB — COVID-19, FLU A+B NAA
Influenza A, NAA: NOT DETECTED
Influenza B, NAA: NOT DETECTED
SARS-CoV-2, NAA: DETECTED — AB

## 2022-04-28 NOTE — ED Provider Notes (Signed)
?  [facility] ? ? ?[id number] ?04/26/22 Arrival Time: 1435 ? ?ASSESSMENT & PLAN: ? ?1. Other fatigue   ?2. Exposure to the flu   ?3. Strep throat   ? ?No signs of peritonsillar abscess. Discussed. ?Mild nausea. Begin: ?Meds ordered this encounter  ?Medications  ? ondansetron (ZOFRAN-ODT) 4 MG disintegrating tablet  ?  Sig: Take 1 tablet (4 mg total) by mouth every 8 (eight) hours as needed for nausea or vomiting.  ?  Dispense:  15 tablet  ?  Refill:  0  ? ?Will try to hydrate at home as able. ? ?Results for orders placed or performed during the hospital encounter of 04/26/22  ?POCT mono screen  ?Result Value Ref Range  ? Mono, POC Negative Negative  ? ?Labs Reviewed  ?COVID-19, FLU A+B NAA  ?POCT MONO SCREEN [facility])  ? ?Continue and finish antibiotic. ? ? ? ?Discharge Instructions   ? ?  ?Please do your best to ensure adequate fluid intake in order to avoid dehydration. If you find that you are unable to tolerate drinking fluids regularly please proceed to the Emergency Department for evaluation. ? ? ? ? ?Reviewed expectations re: course of current medical issues. Questions answered. ?Outlined signs and symptoms indicating need for more acute intervention. ?Patient verbalized understanding. ?After Visit Summary given. ? ? ?SUBJECTIVE: ? ?Wayne Reed is a 26 y.o. male who reports a sore throat. Seen recently; on antibiotic treatment. Presents here today with significant fatigue. Not drinking very much. Mild nausea without emesis. No fever reported. No abd/back pain. ? ? ?OBJECTIVE: ? ?Vitals:  ? 04/26/22 1512 04/26/22 1515  ?BP:  (!) 141/92  ?Pulse: (!) 116   ?Resp: 20   ?Temp: 99.3 ?F (37.4 ?C)   ?TempSrc: Oral   ?SpO2: 98%   ?  ?Tachycardia noted; regular. ? ?General appearance: alert; no distress ?HEENT: throat with mild erythema without tonsil exudate; uvula is midline ?Neck: supple with FROM; small cervical LAD bilat ?Lungs: speaks full sentences without difficulty; unlabored ?Abd: soft;  non-tender ?Skin: reveals no rash; warm and dry ?Psychological: alert and cooperative; normal mood and affect ? ?No Known Allergies ? ?Past Medical History:  ?Diagnosis Date  ? Hemorrhoid   ? ?Social History  ? ?Socioeconomic History  ? Marital status: Single  ?  Spouse name: Not on file  ? Number of children: Not on file  ? Years of education: Not on file  ? Highest education level: Not on file  ?Occupational History  ? Not on file  ?Tobacco Use  ? Smoking status: Never  ? Smokeless tobacco: Never  ?Vaping Use  ? Vaping Use: Never used  ?Substance and Sexual Activity  ? Alcohol use: Not Currently  ?  Alcohol/week: 2.0 standard drinks  ?  Types: 2 Shots of liquor per week  ?  Comment: occassional  ? Drug use: No  ? Sexual activity: Not Currently  ?  Birth control/protection: None  ?Other Topics Concern  ? Not on file  ?Social History Narrative  ? Not on file  ? ?Social Determinants of Health  ? ?Financial Resource Strain: Not on file  ?Food Insecurity: Not on file  ?Transportation Needs: Not on file  ?Physical Activity: Not on file  ?Stress: Not on file  ?Social Connections: Not on file  ?Intimate Partner Violence: Not on file  ? ?No family history on file. ? ? ? ? ? ? ?  ?Mardella Layman, MD ?04/28/22 1022 ? ?

## 2023-01-30 ENCOUNTER — Encounter: Payer: Self-pay | Admitting: Family Medicine

## 2023-01-30 ENCOUNTER — Ambulatory Visit: Payer: 59 | Admitting: Family Medicine

## 2023-01-30 VITALS — BP 124/88 | HR 89 | Resp 16 | Ht 79.0 in | Wt 317.0 lb

## 2023-01-30 DIAGNOSIS — K648 Other hemorrhoids: Secondary | ICD-10-CM

## 2023-01-30 DIAGNOSIS — E7849 Other hyperlipidemia: Secondary | ICD-10-CM

## 2023-01-30 DIAGNOSIS — Z113 Encounter for screening for infections with a predominantly sexual mode of transmission: Secondary | ICD-10-CM

## 2023-01-30 DIAGNOSIS — E559 Vitamin D deficiency, unspecified: Secondary | ICD-10-CM

## 2023-01-30 DIAGNOSIS — E669 Obesity, unspecified: Secondary | ICD-10-CM

## 2023-01-30 DIAGNOSIS — E038 Other specified hypothyroidism: Secondary | ICD-10-CM

## 2023-01-30 DIAGNOSIS — Z1159 Encounter for screening for other viral diseases: Secondary | ICD-10-CM

## 2023-01-30 DIAGNOSIS — R7301 Impaired fasting glucose: Secondary | ICD-10-CM

## 2023-01-30 DIAGNOSIS — K649 Unspecified hemorrhoids: Secondary | ICD-10-CM | POA: Insufficient documentation

## 2023-01-30 HISTORY — DX: Encounter for screening for infections with a predominantly sexual mode of transmission: Z11.3

## 2023-01-30 MED ORDER — HYDROCORTISONE (PERIANAL) 2.5 % EX CREA: 1.0000 | TOPICAL_CREAM | Freq: Two times a day (BID) | CUTANEOUS | 0 refills | Status: DC

## 2023-01-30 NOTE — Assessment & Plan Note (Signed)
He would like to be screened for STDs today No current symptoms voiced

## 2023-01-30 NOTE — Assessment & Plan Note (Signed)
Reports having frequent  external hemorrhoids Denies currently having hemorrhoids Denies constipation and straining Will contact prophylactic treatment with hydrocortisone 2.5% rectal cream Encouraged to increase his intake of fiber and fluid consumption

## 2023-01-30 NOTE — Progress Notes (Signed)
New Patient Office Visit  Subjective:  Patient ID: Wayne Reed, male    DOB: November 12, 1996  Age: 27 y.o. MRN: [id number]  CC:  Chief Complaint  Patient presents with   Establish Care    Hasn't been to a doctor for about 10 years. Wants to get his health in check and work on weight loss and wants to see if he needs to be on medication. Wants labs checked    Hemorrhoids    Had a flare back in August and tried otc therapy. They flare up every now and then    HPI Wayne Reed is a 27 y.o. male with  presents for establishing care. For the details of today's visit, please refer to the assessment and plan.     Past Medical History:  Diagnosis Date   Hemorrhoid    Obesity    Screening for STD (sexually transmitted disease) 01/30/2023    History reviewed. No pertinent surgical history.  Family History  Problem Relation Age of Onset   Hypertension Mother     Social History   Socioeconomic History   Marital status: Single    Spouse name: Not on file   Number of children: Not on file   Years of education: Not on file   Highest education level: Not on file  Occupational History   Not on file  Tobacco Use   Smoking status: Never   Smokeless tobacco: Never  Vaping Use   Vaping Use: Never used  Substance and Sexual Activity   Alcohol use: Not Currently    Comment: occassional   Drug use: No   Sexual activity: Yes    Birth control/protection: None  Other Topics Concern   Not on file  Social History Narrative   Not on file   Social Determinants of Health   Financial Resource Strain: Not on file  Food Insecurity: Not on file  Transportation Needs: Not on file  Physical Activity: Not on file  Stress: Not on file  Social Connections: Not on file  Intimate Partner Violence: Not on file    ROS Review of Systems  Constitutional:  Negative for fatigue and fever.  Eyes:  Negative for visual disturbance.  Respiratory:  Negative for chest tightness and  shortness of breath.   Cardiovascular:  Negative for chest pain and palpitations.  Neurological:  Negative for dizziness and headaches.    Objective:   Today's Vitals: BP 124/88   Pulse 89   Resp 16   Ht 6' 7"$  (2.007 m)   Wt (!) 317 lb (143.8 kg)   SpO2 97%   BMI 35.71 kg/m   Physical Exam Constitutional:      Appearance: [name] obese.  HENT:     Head: Normocephalic.     Right Ear: External ear normal.     Left Ear: External ear normal.     Nose: No congestion or rhinorrhea.     Mouth/Throat:     Mouth: Mucous membranes are moist.  Cardiovascular:     Rate and Rhythm: Regular rhythm.     Heart sounds: No murmur heard. Pulmonary:     Effort: No respiratory distress.     Breath sounds: Normal breath sounds.  Neurological:     Mental Status: [name] alert.      Assessment & Plan:   Screening for STD (sexually transmitted disease) Assessment & Plan: He would like to be screened for STDs today No current symptoms voiced  Orders: -  HIV Antibody (routine testing w rflx) -     T.pallidum Ab, Total -     HSV(herpes simplex vrs) 1+2 ab-IgG -     Chlamydia/Gonococcus/Trichomonas, NAA  Other hemorrhoids Assessment & Plan: Reports having frequent  external hemorrhoids Denies currently having hemorrhoids Denies constipation and straining Will contact prophylactic treatment with hydrocortisone 2.5% rectal cream Encouraged to increase his intake of fiber and fluid consumption  Orders: -     Hydrocortisone (Perianal); Place 1 Application rectally 2 (two) times daily.  Dispense: 30 g; Refill: 0  Obesity (BMI 35.0-39.9 without comorbidity) Assessment & Plan: Reports increased weight gain Admits to minimal physical activity and unhealthy eating habits Will like to discuss weight loss options and to be started on Wegovy Encouraged lifestyle medication with a heart healthy diet and increase physical activity Review my weight management plan Education as follow Eat  three meals per day at times discussed. Cut out all diet bevergages and drink only water Eat whole food plant based meals Cut out junk food, fast food and processed foods Exercise 150 minutes a week Lose 1-2 lbs per week. Keep a food journal Choose foods that grow in a garden or in a fruit orchard and protein of animals with fins or feathers.  Lifestyle Medicine - Whole Food, Plant Predominant Nutrition is highly recommended: Eat Plenty of vegetables, Mushrooms, fruits, Legumes, Whole Grains, Nuts, seeds in lieu of processed meats, processed snacks/pastries red meat, poultry, eggs.  -It is better to avoid simple carbohydrates including: Cakes, Sweet Desserts, Ice Cream, Soda (diet and regular), Sweet Tea, Candies, Chips, Cookies, Store Bought Juices, Alcohol in Excess of  1-2 drinks a day, Lemonade,  Artificial Sweeteners, Doughnuts, Coffee Creamers, "Sugar-free" Products, etc, etc.  This is not a complete list..... Exercise: If you are able: 30 -60 minutes a day ,4 days a week, or 150 minutes a week.  The longer the better.  Combine stretch, strength, and aerobic activities.  If you were told in the past that you have high risk for cardiovascular diseases, you may seek evaluation by your heart doctor prior to initiating moderate to intense exercise programs.     Need for hepatitis C screening test -     Hepatitis C antibody  Vitamin D deficiency -     VITAMIN D 25 Hydroxy (Vit-D Deficiency, Fractures)  IFG (impaired fasting glucose) -     Hemoglobin A1c  Other specified hypothyroidism -     TSH + free T4  Other hyperlipidemia -     Lipid panel -     CMP14+EGFR -     CBC with Differential/Platelet     Follow-up: Return in about 3 months (around 04/30/2023).   Alvira Monday, FNP

## 2023-01-30 NOTE — Assessment & Plan Note (Signed)
Reports increased weight gain Admits to minimal physical activity and unhealthy eating habits Will like to discuss weight loss options and to be started on Wegovy Encouraged lifestyle medication with a heart healthy diet and increase physical activity Review my weight management plan Education as follow Eat three meals per day at times discussed. Cut out all diet bevergages and drink only water Eat whole food plant based meals Cut out junk food, fast food and processed foods Exercise 150 minutes a week Lose 1-2 lbs per week. Keep a food journal Choose foods that grow in a garden or in a fruit orchard and protein of animals with fins or feathers.  Lifestyle Medicine - Whole Food, Plant Predominant Nutrition is highly recommended: Eat Plenty of vegetables, Mushrooms, fruits, Legumes, Whole Grains, Nuts, seeds in lieu of processed meats, processed snacks/pastries red meat, poultry, eggs.  -It is better to avoid simple carbohydrates including: Cakes, Sweet Desserts, Ice Cream, Soda (diet and regular), Sweet Tea, Candies, Chips, Cookies, Store Bought Juices, Alcohol in Excess of  1-2 drinks a day, Lemonade,  Artificial Sweeteners, Doughnuts, Coffee Creamers, "Sugar-free" Products, etc, etc.  This is not a complete list..... Exercise: If you are able: 30 -60 minutes a day ,4 days a week, or 150 minutes a week.  The longer the better.  Combine stretch, strength, and aerobic activities.  If you were told in the past that you have high risk for cardiovascular diseases, you may seek evaluation by your heart doctor prior to initiating moderate to intense exercise programs.

## 2023-01-30 NOTE — Patient Instructions (Addendum)
I appreciate the opportunity to provide care to you today!    Follow up:  3 months  Labs: please stop by the lab today to get your blood drawn (CBC, CMP, TSH, Lipid profile, HgA1c, Vit D)  Screening: HIV  Hep C, gonorrhea, chlamydia, syphilis, and herpes  Please pick up your medication at the pharmacy for your hemorrhoids  What are the causes of hemorrhoids? This condition may be caused by: Having trouble pooping (constipation). Pushing hard (straining) to poop. Watery poop (diarrhea). Pregnancy. Being very overweight (obese). Sitting for long periods of time. Heavy lifting or other activity that causes you to strain. Anal sex. Riding a bike for a long period of time.   Please continue to a heart-healthy diet and increase your physical activities. Try to exercise for 28mns at least five times a week.   Physical activity helps: Lower your blood glucose, improve your heart health, lower your blood pressure and cholesterol, burn calories to help manage her weight, gave you energy, lower stress, and improve his sleep.  The American diabetes Association (ADA) recommends being active for 2-1/2 hours (150 minutes) or more week.  Exercise for 30 minutes, 5 days a week (150 minutes total)    Eat three meals per day at times discussed. Cut out all diet bevergages and drink only water Eat whole food plant based meals Cut out junk food, fast food and processed foods Exercise 150 minutes a week Lose 1-2 lbs per week. Keep a food journal Choose foods that grow in a garden or in a fruit orchard and protein of animals with fins or feathers.  Lifestyle Medicine - Whole Food, Plant Predominant Nutrition is highly recommended: Eat Plenty of vegetables, Mushrooms, fruits, Legumes, Whole Grains, Nuts, seeds in lieu of processed meats, processed snacks/pastries red meat, poultry, eggs.  -It is better to avoid simple carbohydrates including: Cakes, Sweet Desserts, Ice Cream, Soda (diet and  regular), Sweet Tea, Candies, Chips, Cookies, Store Bought Juices, Alcohol in Excess of  1-2 drinks a day, Lemonade,  Artificial Sweeteners, Doughnuts, Coffee Creamers, "Sugar-free" Products, etc, etc.  This is not a complete list..... Exercise: If you are able: 30 -60 minutes a day ,4 days a week, or 150 minutes a week.  The longer the better.  Combine stretch, strength, and aerobic activities.  If you were told in the past that you have high risk for cardiovascular diseases, you may seek evaluation by your heart doctor prior to initiating moderate to intense exercise programs.  It was a pleasure to see you and I look forward to continuing to work together on your health and well-being. Please do not hesitate to call the office if you need care or have questions about your care.   Have a wonderful day and week. With Gratitude, GAlvira MondayMSN, FNP-BC

## 2023-02-04 LAB — CBC WITH DIFFERENTIAL/PLATELET
Basophils Absolute: 0 10*3/uL (ref 0.0–0.2)
Basos: 1 %
EOS (ABSOLUTE): 0.2 10*3/uL (ref 0.0–0.4)
Eos: 3 %
Hematocrit: 46.2 % (ref 37.5–51.0)
Hemoglobin: 15.1 g/dL (ref 13.0–17.7)
Immature Grans (Abs): 0 10*3/uL (ref 0.0–0.1)
Immature Granulocytes: 0 %
Lymphocytes Absolute: 3.5 10*3/uL — ABNORMAL HIGH (ref 0.7–3.1)
Lymphs: 46 %
MCH: 27.5 pg (ref 26.6–33.0)
MCHC: 32.7 g/dL (ref 31.5–35.7)
MCV: 84 fL (ref 79–97)
Monocytes Absolute: 0.6 10*3/uL (ref 0.1–0.9)
Monocytes: 8 %
Neutrophils Absolute: 3 10*3/uL (ref 1.4–7.0)
Neutrophils: 42 %
Platelets: 326 10*3/uL (ref 150–450)
RBC: 5.5 x10E6/uL (ref 4.14–5.80)
RDW: 13.3 % (ref 11.6–15.4)
WBC: 7.3 10*3/uL (ref 3.4–10.8)

## 2023-02-04 LAB — CMP14+EGFR
ALT: 33 IU/L (ref 0–44)
AST: 21 IU/L (ref 0–40)
Albumin/Globulin Ratio: 1.7 (ref 1.2–2.2)
Albumin: 4.7 g/dL (ref 4.3–5.2)
Alkaline Phosphatase: 81 IU/L (ref 44–121)
BUN/Creatinine Ratio: 12 (ref 9–20)
BUN: 11 mg/dL (ref 6–20)
Bilirubin Total: 0.4 mg/dL (ref 0.0–1.2)
CO2: 18 mmol/L — ABNORMAL LOW (ref 20–29)
Calcium: 9.8 mg/dL (ref 8.7–10.2)
Chloride: 103 mmol/L (ref 96–106)
Creatinine, Ser: 0.92 mg/dL (ref 0.76–1.27)
Globulin, Total: 2.7 g/dL (ref 1.5–4.5)
Glucose: 95 mg/dL (ref 70–99)
Potassium: 4.5 mmol/L (ref 3.5–5.2)
Sodium: 138 mmol/L (ref 134–144)
Total Protein: 7.4 g/dL (ref 6.0–8.5)
eGFR: 116 mL/min/{1.73_m2} (ref >59)

## 2023-02-04 LAB — TSH+FREE T4
Free T4: 1.14 ng/dL (ref 0.82–1.77)
TSH: 3.79 u[IU]/mL (ref 0.450–4.500)

## 2023-02-04 LAB — T.PALLIDUM AB, TOTAL: Treponema pallidum Antibodies: NONREACTIVE

## 2023-02-04 LAB — LIPID PANEL
Chol/HDL Ratio: 3.7 ratio (ref 0.0–5.0)
Cholesterol, Total: 129 mg/dL (ref 100–199)
HDL: 35 mg/dL — ABNORMAL LOW (ref >39)
LDL Chol Calc (NIH): 72 mg/dL (ref 0–99)
Triglycerides: 120 mg/dL (ref 0–149)
VLDL Cholesterol Cal: 22 mg/dL (ref 5–40)

## 2023-02-04 LAB — HSV 1 AND 2 AB, IGG
HSV 1 Glycoprotein G Ab, IgG: 5.7 index — ABNORMAL HIGH (ref 0.00–0.90)
HSV 2 IgG, Type Spec: 1.62 index — ABNORMAL HIGH (ref 0.00–0.90)

## 2023-02-04 LAB — HIV ANTIBODY (ROUTINE TESTING W REFLEX): HIV Screen 4th Generation wRfx: NONREACTIVE

## 2023-02-04 LAB — VITAMIN D 25 HYDROXY (VIT D DEFICIENCY, FRACTURES): Vit D, 25-Hydroxy: 11.6 ng/mL — ABNORMAL LOW (ref 30.0–100.0)

## 2023-02-04 LAB — HEMOGLOBIN A1C
Est. average glucose Bld gHb Est-mCnc: 108 mg/dL
Hgb A1c MFr Bld: 5.4 % (ref 4.8–5.6)

## 2023-02-04 LAB — HEPATITIS C ANTIBODY: Hep C Virus Ab: NONREACTIVE

## 2023-02-08 LAB — CHLAMYDIA/GONOCOCCUS/TRICHOMONAS, NAA
Chlamydia by NAA: NEGATIVE
Gonococcus by NAA: NEGATIVE
Trich vag by NAA: NEGATIVE

## 2023-02-11 ENCOUNTER — Other Ambulatory Visit: Payer: Self-pay | Admitting: Family Medicine

## 2023-02-11 DIAGNOSIS — E559 Vitamin D deficiency, unspecified: Secondary | ICD-10-CM

## 2023-02-11 MED ORDER — VITAMIN D (ERGOCALCIFEROL) 1.25 MG (50000 UNIT) PO CAPS: 50000.0000 [IU] | ORAL_CAPSULE | ORAL | 1 refills | Status: DC

## 2023-05-05 ENCOUNTER — Other Ambulatory Visit: Payer: Self-pay

## 2023-05-05 ENCOUNTER — Telehealth: Payer: Self-pay | Admitting: Family Medicine

## 2023-05-05 DIAGNOSIS — Z202 Contact with and (suspected) exposure to infections with a predominantly sexual mode of transmission: Secondary | ICD-10-CM

## 2023-05-05 NOTE — Telephone Encounter (Signed)
Pt called in wants a cll back to go over lab results

## 2023-05-05 NOTE — Telephone Encounter (Signed)
Pt reports getting a call from the state regarding his labs says they told him he was at a possible exposure for syphillis according to labs done through [place] in February, he is wanting to restest for hsv 1 & 1 and to incule syphillis, orders have been put in.

## 2023-05-05 NOTE — Telephone Encounter (Signed)
Lmtrc

## 2023-05-08 ENCOUNTER — Ambulatory Visit: Payer: 59 | Admitting: Family Medicine

## 2023-06-01 ENCOUNTER — Telehealth: Payer: Self-pay

## 2023-06-01 NOTE — Telephone Encounter (Signed)
Patient called to inform provider that he has spoken with his insurance and he would like to proceed with a prescription for [facility] [place] for weight loss.   Patient aware insurance will require a prior auth and the prescription may take time to fulfill.

## 2023-06-02 NOTE — Telephone Encounter (Signed)
Please encourage the patient to schedule an office visit

## 2023-06-03 NOTE — Telephone Encounter (Signed)
Scheduled

## 2023-06-18 ENCOUNTER — Ambulatory Visit: Payer: Self-pay | Admitting: Family Medicine

## 2023-06-22 ENCOUNTER — Encounter: Payer: Self-pay | Admitting: Plastic Surgery

## 2023-09-06 ENCOUNTER — Emergency Department (HOSPITAL_COMMUNITY)
Admission: EM | Admit: 2023-09-06 | Discharge: 2023-09-06 | Disposition: A | Discharge status: Home / Self Care | Payer: BC Managed Care – PPO | Attending: Emergency Medicine | Admitting: Emergency Medicine

## 2023-09-06 ENCOUNTER — Other Ambulatory Visit: Payer: Self-pay

## 2023-09-06 DIAGNOSIS — R5383 Other fatigue: Secondary | ICD-10-CM | POA: Diagnosis present

## 2023-09-06 DIAGNOSIS — Z1152 Encounter for screening for COVID-19: Secondary | ICD-10-CM | POA: Diagnosis not present

## 2023-09-06 DIAGNOSIS — J0110 Acute frontal sinusitis, unspecified: Secondary | ICD-10-CM | POA: Insufficient documentation

## 2023-09-06 LAB — RESP PANEL BY RT-PCR (RSV, FLU A&B, COVID)  RVPGX2
Influenza A by PCR: NEGATIVE
Influenza B by PCR: NEGATIVE
Resp Syncytial Virus by PCR: NEGATIVE
SARS Coronavirus 2 by RT PCR: NEGATIVE

## 2023-09-06 MED ORDER — AMOXICILLIN-POT CLAVULANATE 875-125 MG PO TABS: 1.0000 | ORAL_TABLET | Freq: Two times a day (BID) | ORAL | 0 refills | Status: AC

## 2023-09-06 MED ORDER — AMOXICILLIN-POT CLAVULANATE 875-125 MG PO TABS
1.0000 | ORAL_TABLET | Freq: Once | ORAL | Status: AC
  Administered 2023-09-06: 1 via ORAL
  Filled 2023-09-06: qty 1

## 2023-09-06 NOTE — ED Triage Notes (Signed)
Body aches and fatigue x 3 days. Today was cleaning ears out and had blood in left ear. Fatigue has been getting worse over past 3 days.

## 2023-09-06 NOTE — ED Provider Notes (Signed)
[facility]GENCY DEPARTMENT AT [facility] Provider Note   CSN: [id number] Arrival date & time: 09/06/23  1943     History Chief Complaint  Patient presents with   Generalized Body Aches    Wayne Reed is a 27 y.o. male.  Patient presents to the emergency department concerns of bodyaches and fatigue for the last 3 days.  Also endorses that he had some minimal ear bleeding from the left ear earlier today when he was cleaning it out with a Q-tip.  Denies any fevers but does report some cough and congestion.  No recent or obvious sick contacts.  Does believe that he may be under some more stress recently as he recently had a death in the family and has not been sleeping well.  Denies any nausea, vomiting, diarrhea, abdominal pain, chest pain, shortness of breath, dysuria, hematuria, or increased urinary frequency or urgency.  HPI     Home Medications Prior to Admission medications   Medication Sig Start Date End Date Taking? Authorizing Provider  amoxicillin-clavulanate (AUGMENTIN) 875-125 MG tablet Take 1 tablet by mouth 2 (two) times daily for 7 days. 09/06/23 09/13/23 Yes Smitty Knudsen, PA-C  Vitamin D, Ergocalciferol, (DRISDOL) 1.25 MG (50000 UNIT) CAPS capsule Take 1 capsule (50,000 Units total) by mouth every 7 (seven) days. 02/11/23   Gilmore Laroche, FNP  hydrocortisone (ANUSOL-HC) 2.5 % rectal cream Place 1 Application rectally 2 (two) times daily. 01/30/23   Gilmore Laroche, FNP  lidocaine (XYLOCAINE) 2 % solution Use as directed 15 mLs in the mouth or throat every 6 (six) hours as needed for mouth pain. 04/23/22   Leath-Warren, Sadie Haber, NP      Allergies    Patient has no known allergies.    Review of Systems   Review of Systems  Constitutional:  Positive for chills.  All other systems reviewed and are negative.   Physical Exam Updated Vital Signs BP 102/86   Pulse 88   Temp 99.5 F (37.5 C)   Resp 16   SpO2 99%  Physical Exam Vitals and  nursing note reviewed.  Constitutional:      General: [name] not in acute distress.    Appearance: [name] well-developed.  HENT:     Head: Normocephalic and atraumatic.     Comments: Some frontal sinus pressure and tenderness on exam Eyes:     Conjunctiva/sclera: Conjunctivae normal.  Cardiovascular:     Rate and Rhythm: Normal rate and regular rhythm.     Heart sounds: No murmur heard. Pulmonary:     Effort: Pulmonary effort is normal. No respiratory distress.     Breath sounds: Normal breath sounds.  Abdominal:     Palpations: Abdomen is soft.     Tenderness: There is no abdominal tenderness.  Musculoskeletal:        General: No swelling.     Cervical back: Neck supple.  Skin:    General: Skin is warm and dry.     Capillary Refill: Capillary refill takes less than 2 seconds.  Neurological:     Mental Status: [name] alert.  Psychiatric:        Mood and Affect: Mood normal.     ED Results / Procedures / Treatments   Labs (all labs ordered are listed, but only abnormal results are displayed) Labs Reviewed  RESP PANEL BY RT-PCR (RSV, FLU A&B, COVID)  [id number]    EKG None  Radiology No results found.  Procedures Procedures  Medications Ordered in ED Medications  amoxicillin-clavulanate (AUGMENTIN) 875-125 MG per tablet 1 tablet (1 tablet Oral Given 09/06/23 2224)    ED Course/ Medical Decision Making/ A&P                               Medical Decision Making Risk Prescription drug management.   This patient presents to the ED for concern of generalized bodyaches.  Differential diagnosis includes COVID-19, pneumonia, strep pharyngitis, sinus infection    Lab Tests:  I Ordered, and personally interpreted labs.  The pertinent results include: Patient negative for COVID-19, influenza, RSV   Medicines ordered and prescription drug management:  I ordered medication including Augmentin for sinus infection Reevaluation of the patient after these medicines  showed that the patient stayed the same I have reviewed the patients home medicines and have made adjustments as needed   Problem List / ED Course:  Patient presents to the emergency department concerns of generalized bodyaches.  Reports has been ongoing for the last 3 days with some associated fatigue.  Also reports that he was cleaning out his ears earlier today, no blood on the cotton swab after cleaning his left ear.  Reports that the fatigue has been worsening at last 3 days.  He does also state that he has been sleeping poorly recently due to the recent loss in his family.  Has been awake more due to the stress of this as well as leading to plan funeral services. Workup with respiratory viral panel which was indeed negative.  On further questioning patient appears that patient has been having frontal sinus pressure for approximately 7 days at this point.  Subjective fever and chills but nothing measured recorded.  Patient has been managing with over-the-counter pain medications without improvement or resolution of symptoms.  Exam is concerning for frontal sinus pressure and tenderness.  Consistent with frontal sinusitis.  Given the patient is having treated with any antibiotics, will treat with Augmentin.  Suspect symptoms could also be worsened due to recent stress given family loss.  Advised patient to continue taking Augmentin at home as prescribed.  Discussed strict return precautions.  All questions answered prior to patient discharge.  Patient discharged home in stable condition.  Final Clinical Impression(s) / ED Diagnoses Final diagnoses:  Acute non-recurrent frontal sinusitis    Rx / DC Orders ED Discharge Orders          Ordered    amoxicillin-clavulanate (AUGMENTIN) 875-125 MG tablet  2 times daily        09/06/23 2218              Smitty Knudsen, PA-C 09/09/23 1558    Sloan Leiter, DO 09/12/23 [phone]

## 2023-09-06 NOTE — Discharge Instructions (Signed)
You are seen in the emergency department today for generalized body aches and congestion.  You rest your viral panel was negative for COVID, influenza, RSV.  I suspect your symptoms are likely due to a sinus infection.  You are given a dose of Augmentin here in the emergency department and prescription for this medication was also sent to your pharmacy.  Please take this medication as prescribed.  Your symptoms are worsening, please return the emergency department or seek care with your primary care provider.

## 2023-12-01 ENCOUNTER — Encounter: Payer: Self-pay | Admitting: Family Medicine

## 2023-12-07 ENCOUNTER — Telehealth: Payer: BC Managed Care – PPO

## 2023-12-07 DIAGNOSIS — R8290 Unspecified abnormal findings in urine: Secondary | ICD-10-CM

## 2023-12-08 NOTE — Progress Notes (Signed)
E-Visit for Urinary Problems ? ?Based on what you shared with me, I feel your condition warrants further evaluation and I recommend that you be seen for a face to face office visit.  Male bladder infections are not very common.  We worry about prostate or kidney conditions.  The standard of care is to examine the abdomen and kidneys, and to do a urine and blood test to make sure that something more serious is not going on.  We recommend that you see a provider today.  If your doctor's office is closed [facility] has the following Urgent Cares: ? ?  ?NOTE: You will not be charged for this e-visit. ? ?If you are having a true medical emergency please call 911.   ? ?  ? For an urgent face to face visit, [facility] has six urgent care centers for your convenience:  ?  ? [facility] [place] ?Get Driving Directions ?[phone] ?[address] ?[place], [place] [address] ?  ? [facility] ([place]) ?Get Driving Directions ?[phone] ?[address] ?[place], [place] [address] ? ?[facility] [facility] [place]) ?Get Driving Directions ?[phone] ?[address] ?[place],  [place]  [address] ? ?[facility] ?Get Driving Directions ?[phone] ?[address] ?[place], [place] [address] ?  ?[facility] [place] ?Get Driving Directions  ?[phone] ?[address] ?[address] ?[place], [place] [address] ?  ? [facility] at [facility] ?Get Driving Directions ?[phone] ?[address] ?[place], [place] [address] ? ?Your MyChart E-visit questionnaire answers were reviewed by a board certified advanced clinical practitioner to complete your personal care plan based on your specific symptoms.  Thank you for using e-Visits. ?

## 2023-12-11 ENCOUNTER — Ambulatory Visit: Payer: BC Managed Care – PPO | Admitting: Family Medicine

## 2023-12-22 ENCOUNTER — Ambulatory Visit: Payer: BC Managed Care – PPO | Admitting: Family Medicine

## 2023-12-28 ENCOUNTER — Ambulatory Visit (INDEPENDENT_AMBULATORY_CARE_PROVIDER_SITE_OTHER): Payer: BC Managed Care – PPO | Admitting: Family Medicine

## 2023-12-28 ENCOUNTER — Encounter: Payer: Self-pay | Admitting: Family Medicine

## 2023-12-28 ENCOUNTER — Ambulatory Visit (HOSPITAL_COMMUNITY)
Admission: RE | Admit: 2023-12-28 | Discharge: 2023-12-28 | Disposition: A | Discharge status: Home / Self Care | Payer: BC Managed Care – PPO | Source: Ambulatory Visit | Attending: Family Medicine | Admitting: Family Medicine

## 2023-12-28 VITALS — BP 137/81 | HR 87 | Ht 79.0 in | Wt 329.0 lb

## 2023-12-28 DIAGNOSIS — M545 Low back pain, unspecified: Secondary | ICD-10-CM | POA: Insufficient documentation

## 2023-12-28 DIAGNOSIS — R7301 Impaired fasting glucose: Secondary | ICD-10-CM | POA: Diagnosis not present

## 2023-12-28 DIAGNOSIS — E669 Obesity, unspecified: Secondary | ICD-10-CM

## 2023-12-28 DIAGNOSIS — E559 Vitamin D deficiency, unspecified: Secondary | ICD-10-CM

## 2023-12-28 DIAGNOSIS — E7849 Other hyperlipidemia: Secondary | ICD-10-CM

## 2023-12-28 DIAGNOSIS — E038 Other specified hypothyroidism: Secondary | ICD-10-CM

## 2023-12-28 MED ORDER — PHENTERMINE HCL 15 MG PO CAPS: 15.0000 mg | ORAL_CAPSULE | ORAL | 0 refills | Status: DC

## 2023-12-28 MED ORDER — CYCLOBENZAPRINE HCL 5 MG PO TABS: 5.0000 mg | ORAL_TABLET | Freq: Every day | ORAL | 1 refills | Status: AC

## 2023-12-28 MED ORDER — IBUPROFEN 600 MG PO TABS: 600.0000 mg | ORAL_TABLET | Freq: Four times a day (QID) | ORAL | 1 refills | Status: DC | PRN

## 2023-12-28 MED ORDER — WEGOVY 0.25 MG/0.5ML ~~LOC~~ SOAJ: 0.2500 mg | SUBCUTANEOUS | 0 refills | Status: DC

## 2023-12-28 NOTE — Progress Notes (Signed)
 Established Patient Office Visit  Subjective:  Patient ID: Wayne Reed, male    DOB: January 19, 1996  Age: 28 y.o. MRN: [id number]  CC:  Chief Complaint  Patient presents with   Care Management    Office visit, wants to discuss concerns that were talked about at last visit.     HPI Wayne Reed is a 28 y.o. male with past medical history of obesity, lower back pain presents for f/u of  chronic medical conditions. For the details of today's visit, please refer to the assessment and plan.     Past Medical History:  Diagnosis Date   Hemorrhoid    Obesity    Screening for STD (sexually transmitted disease) 01/30/2023    History reviewed. No pertinent surgical history.  Family History  Problem Relation Age of Onset   Hypertension Mother     Social History   Socioeconomic History   Marital status: Single    Spouse name: Not on file   Number of children: Not on file   Years of education: Not on file   Highest education level: 12th grade  Occupational History   Not on file  Tobacco Use   Smoking status: Never   Smokeless tobacco: Never  Vaping Use   Vaping status: Never Used  Substance and Sexual Activity   Alcohol use: Not Currently    Comment: occassional   Drug use: No   Sexual activity: Yes    Birth control/protection: None  Other Topics Concern   Not on file  Social History Narrative   Not on file   Social Drivers of Health   Financial Resource Strain: Patient Declined (12/08/2023)   Overall Financial Resource Strain (CARDIA)    Difficulty of Paying Living Expenses: Patient declined  Food Insecurity: Patient Declined (12/08/2023)   Hunger Vital Sign    Worried About Running Out of Food in the Last Year: Patient declined    Ran Out of Food in the Last Year: Patient declined  Transportation Needs: Patient Declined (12/08/2023)   PRAPARE - Administrator, Civil Service (Medical): Patient declined    Lack of Transportation  (Non-Medical): Patient declined  Physical Activity: Sufficiently Active (12/08/2023)   Exercise Vital Sign    Days of Exercise per Week: 4 days    Minutes of Exercise per Session: 150+ min  Stress: Patient Declined (12/08/2023)   [employer] of Occupational Health - Occupational Stress Questionnaire    Feeling of Stress : Patient declined  Social Connections: Unknown (12/08/2023)   Social Connection and Isolation Panel [NHANES]    Frequency of Communication with Friends and Family: Patient declined    Frequency of Social Gatherings with Friends and Family: Patient declined    Attends Religious Services: Patient declined    Database Administrator or Organizations: Yes    Attends Engineer, Structural: More than 4 times per year    Marital Status: Patient declined  Intimate Partner Violence: Unknown (03/27/2022)   Received from [employer], [facility]   HITS    Physically Hurt: Not on file    Insult or Talk Down To: Not on file    Threaten Physical Harm: Not on file    Scream or Curse: Not on file    Outpatient Medications Prior to Visit  Medication Sig Dispense Refill   hydrocortisone  (ANUSOL -HC) 2.5 % rectal cream Place 1 Application rectally 2 (two) times daily. (Patient not taking: Reported on 12/28/2023) 30 g 0   lidocaine  (  XYLOCAINE ) 2 % solution Use as directed 15 mLs in the mouth or throat every 6 (six) hours as needed for mouth pain. 100 mL 0   Vitamin D , Ergocalciferol , (DRISDOL ) 1.25 MG (50000 UNIT) CAPS capsule Take 1 capsule (50,000 Units total) by mouth every 7 (seven) days. (Patient not taking: Reported on 12/28/2023) 10 capsule 1   No facility-administered medications prior to visit.    No Known Allergies  ROS Review of Systems  Constitutional:  Negative for fatigue and fever.  Eyes:  Negative for visual disturbance.  Respiratory:  Negative for chest tightness and shortness of breath.   Cardiovascular:  Negative for chest pain and palpitations.   Musculoskeletal:  Positive for back pain.  Neurological:  Negative for dizziness and headaches.      Objective:    Physical Exam Constitutional:      Appearance: [name] obese.  HENT:     Head: Normocephalic.     Right Ear: External ear normal.     Left Ear: External ear normal.     Nose: No congestion or rhinorrhea.     Mouth/Throat:     Mouth: Mucous membranes are moist.  Cardiovascular:     Rate and Rhythm: Regular rhythm.     Heart sounds: No murmur heard. Pulmonary:     Effort: No respiratory distress.     Breath sounds: Normal breath sounds.  Musculoskeletal:     Lumbar back: Negative right straight leg raise test and negative left straight leg raise test.  Neurological:     Mental Status: [name] alert.     BP 137/81 (BP Location: Left Arm)   Pulse 87   Ht 6' 7 (2.007 m)   Wt (!) 329 lb (149.2 kg)   SpO2 97%   BMI 37.06 kg/m  Wt Readings from Last 3 Encounters:  12/28/23 (!) 329 lb (149.2 kg)  01/30/23 (!) 317 lb (143.8 kg)  04/26/22 (!) 320 lb 1.6 oz (145.2 kg)    Lab Results  Component Value Date   TSH 3.790 02/02/2023   Lab Results  Component Value Date   WBC 7.3 02/02/2023   HGB 15.1 02/02/2023   HCT 46.2 02/02/2023   MCV 84 02/02/2023   PLT 326 02/02/2023   Lab Results  Component Value Date   NA 138 02/02/2023   K 4.5 02/02/2023   CO2 18 (L) 02/02/2023   GLUCOSE 95 02/02/2023   BUN 11 02/02/2023   CREATININE 0.92 02/02/2023   BILITOT 0.4 02/02/2023   ALKPHOS 81 02/02/2023   AST 21 02/02/2023   ALT 33 02/02/2023   PROT 7.4 02/02/2023   ALBUMIN 4.7 02/02/2023   CALCIUM 9.8 02/02/2023   ANIONGAP 11 04/08/2018   EGFR 116 02/02/2023   Lab Results  Component Value Date   CHOL 129 02/02/2023   Lab Results  Component Value Date   HDL 35 (L) 02/02/2023   Lab Results  Component Value Date   LDLCALC 72 02/02/2023   Lab Results  Component Value Date   TRIG 120 02/02/2023   Lab Results  Component Value Date   CHOLHDL 3.7  02/02/2023   Lab Results  Component Value Date   HGBA1C 5.4 02/02/2023      Assessment & Plan:  Lumbar pain Assessment & Plan: The patient reports no recent injury or trauma. His BMI is 37.06, and he complains of intermittent back pain, which he rates as 6/10 and describes as sore and achy. He reports not taking any medication for  pain relief. His back pain is chronic, and he has previously seen a chiropractor for treatment, but he declines physical therapy at the moment, mentioning that past physical therapy for chronic back pain provided minimal relief. I encouraged the patient to maintain a healthy weight, as this could be contributing to his back pain, and advised him to avoid aggravating activities such as prolonged standing, sitting, heavy lifting, and improper lifting of heavy objects. I also recommended applying heat and cold therapy, taking rest, and using over-the-counter Advil  Targeted Relief pain-relieving cream to the affected site. No other regular symptoms were reported, and the patient is encouraged to follow up if symptoms worsen or fail to improve. An imaging study will be obtained to rule out any pathology of the lower back.   Orders: -     DG Lumbar Spine Complete -     Cyclobenzaprine  HCl; Take 1 tablet (5 mg total) by mouth at bedtime.  Dispense: 30 tablet; Refill: 1 -     Ibuprofen ; Take 1 tablet (600 mg total) by mouth every 6 (six) hours as needed.  Dispense: 60 tablet; Refill: 1  Obesity (BMI 35.0-39.9 without comorbidity) Assessment & Plan: The patient reports that he has implemented lifestyle changes for the past three months with minimal changes in his weight. He also reports that his insurance now covers Wegovy . We will initiate therapy with Wegovy  0.25 mg and encourage the patient to request medication refills monthly. I emphasized the importance of a low-fat, low-calorie diet with increased physical activity (moderate intensity, 150 minutes weekly) for optimal  results. The patient verbalized understanding of the plan of care.  Wt Readings from Last 3 Encounters:  12/28/23 (!) 329 lb (149.2 kg)  01/30/23 (!) 317 lb (143.8 kg)  04/26/22 (!) 320 lb 1.6 oz (145.2 kg)     Orders: -     Wegovy ; Inject 0.25 mg into the skin once a week.  Dispense: 2 mL; Refill: 0  IFG (impaired fasting glucose) -     Hemoglobin A1c  Vitamin D  deficiency -     VITAMIN D  25 Hydroxy (Vit-D Deficiency, Fractures)  TSH (thyroid-stimulating hormone deficiency) -     TSH + free T4  Other hyperlipidemia -     Lipid panel -     CMP14+EGFR -     CBC with Differential/Platelet  Note: This chart has been completed using Engineer, Civil (consulting) software, and while attempts have been made to ensure accuracy, certain words and phrases may not be transcribed as intended.    Follow-up: Return in about 1 month (around 01/28/2024).   Zamariah Seaborn, FNP

## 2023-12-28 NOTE — Assessment & Plan Note (Signed)
 The patient reports no recent injury or trauma. His BMI is 37.06, and he complains of intermittent back pain, which he rates as 6/10 and describes as sore and achy. He reports not taking any medication for pain relief. His back pain is chronic, and he has previously seen a chiropractor for treatment, but he declines physical therapy at the moment, mentioning that past physical therapy for chronic back pain provided minimal relief. I encouraged the patient to maintain a healthy weight, as this could be contributing to his back pain, and advised him to avoid aggravating activities such as prolonged standing, sitting, heavy lifting, and improper lifting of heavy objects. I also recommended applying heat and cold therapy, taking rest, and using over-the-counter Advil  Targeted Relief pain-relieving cream to the affected site. No other regular symptoms were reported, and the patient is encouraged to follow up if symptoms worsen or fail to improve. An imaging study will be obtained to rule out any pathology of the lower back.

## 2023-12-28 NOTE — Assessment & Plan Note (Addendum)
 The patient reports that he has implemented lifestyle changes for the past three months with minimal changes in his weight. He also reports that his insurance now covers Wegovy . We will initiate therapy with Wegovy  0.25 mg and encourage the patient to request medication refills monthly. I emphasized the importance of a low-fat, low-calorie diet with increased physical activity (moderate intensity, 150 minutes weekly) for optimal results. The patient verbalized understanding of the plan of care.  Wt Readings from Last 3 Encounters:  12/28/23 (!) 329 lb (149.2 kg)  01/30/23 (!) 317 lb (143.8 kg)  04/26/22 (!) 320 lb 1.6 oz (145.2 kg)

## 2023-12-28 NOTE — Patient Instructions (Addendum)
 I appreciate the opportunity to provide care to you today!    Follow up:  1 month  Labs: please stop by the lab today/during the week to get your blood drawn (CBC, CMP, TSH, Lipid profile, HgA1c, Vit D)  Obesity Start taking phentermine  15 mg daily. Common side effects of the medication include a change in taste, diarrhea, dizziness, dry mouth, restlessness, palpitations, arrhythmia, and trouble sleeping. I recommend continuing a low-fat, low-calorie diet with increased physical activity (moderate intensity, 150 minutes weekly). We will follow up monthly to assess the response to therapy.  Low Back Pain Please stop by [facility] to get an x-ray of your lower back. I recommend maintaining a healthy weight, avoiding aggravating activities (including prolonged sitting and standing, lifting objects improperly, and heavy lifting, which can strain the back muscles). Perform strengthening and stretching exercises, alternating with heat application to the lower back. Apply over-the-counter Advil  Targeted Relief pain-relieving cream to the affected site and take ibuprofen  600 mg every 6 hours as needed for pain relief and flexeril  5 mg at bedtime  Attached with your AVS, you will find valuable resources for self-education. I highly recommend dedicating some time to thoroughly examine them.   Please continue to a heart-healthy diet and increase your physical activities. Try to exercise for at least five days a week.    It was a pleasure to see you and I look forward to continuing to work together on your health and well-being. Please do not hesitate to call the office if you need care or have questions about your care.  In case of emergency, please visit the Emergency Department for urgent care, or contact our clinic at [phone] to schedule an appointment. We're here to help you!   Have a wonderful day and week. With Gratitude, Jameila Keeny MSN, FNP-BC

## 2023-12-29 LAB — CBC WITH DIFFERENTIAL/PLATELET
Basophils Absolute: 0 10*3/uL (ref 0.0–0.2)
Basos: 1 %
EOS (ABSOLUTE): 0.1 10*3/uL (ref 0.0–0.4)
Eos: 2 %
Hematocrit: 46.1 % (ref 37.5–51.0)
Hemoglobin: 14.5 g/dL (ref 13.0–17.7)
Immature Grans (Abs): 0 10*3/uL (ref 0.0–0.1)
Immature Granulocytes: 0 %
Lymphocytes Absolute: 2.8 10*3/uL (ref 0.7–3.1)
Lymphs: 39 %
MCH: 27 pg (ref 26.6–33.0)
MCHC: 31.5 g/dL (ref 31.5–35.7)
MCV: 86 fL (ref 79–97)
Monocytes Absolute: 0.5 10*3/uL (ref 0.1–0.9)
Monocytes: 7 %
Neutrophils Absolute: 3.8 10*3/uL (ref 1.4–7.0)
Neutrophils: 51 %
Platelets: 342 10*3/uL (ref 150–450)
RBC: 5.38 x10E6/uL (ref 4.14–5.80)
RDW: 14.4 % (ref 11.6–15.4)
WBC: 7.3 10*3/uL (ref 3.4–10.8)

## 2023-12-29 LAB — HEMOGLOBIN A1C
Est. average glucose Bld gHb Est-mCnc: 108 mg/dL
Hgb A1c MFr Bld: 5.4 % (ref 4.8–5.6)

## 2023-12-29 LAB — VITAMIN D 25 HYDROXY (VIT D DEFICIENCY, FRACTURES): Vit D, 25-Hydroxy: 13.9 ng/mL — ABNORMAL LOW (ref 30.0–100.0)

## 2023-12-29 LAB — CMP14+EGFR
ALT: 31 [IU]/L (ref 0–44)
AST: 24 [IU]/L (ref 0–40)
Albumin: 4.8 g/dL (ref 4.3–5.2)
Alkaline Phosphatase: 85 [IU]/L (ref 44–121)
BUN/Creatinine Ratio: 12 (ref 9–20)
BUN: 11 mg/dL (ref 6–20)
Bilirubin Total: 0.3 mg/dL (ref 0.0–1.2)
CO2: 21 mmol/L (ref 20–29)
Calcium: 9.8 mg/dL (ref 8.7–10.2)
Chloride: 102 mmol/L (ref 96–106)
Creatinine, Ser: 0.93 mg/dL (ref 0.76–1.27)
Globulin, Total: 2.9 g/dL (ref 1.5–4.5)
Glucose: 82 mg/dL (ref 70–99)
Potassium: 4.2 mmol/L (ref 3.5–5.2)
Sodium: 138 mmol/L (ref 134–144)
Total Protein: 7.7 g/dL (ref 6.0–8.5)
eGFR: 114 mL/min/{1.73_m2} (ref >59)

## 2023-12-29 LAB — TSH+FREE T4
Free T4: 1.06 ng/dL (ref 0.82–1.77)
TSH: 3.34 u[IU]/mL (ref 0.450–4.500)

## 2023-12-29 LAB — LIPID PANEL
Chol/HDL Ratio: 3.7 {ratio} (ref 0.0–5.0)
Cholesterol, Total: 139 mg/dL (ref 100–199)
HDL: 38 mg/dL — ABNORMAL LOW (ref >39)
LDL Chol Calc (NIH): 85 mg/dL (ref 0–99)
Triglycerides: 84 mg/dL (ref 0–149)
VLDL Cholesterol Cal: 16 mg/dL (ref 5–40)

## 2024-01-02 ENCOUNTER — Other Ambulatory Visit: Payer: Self-pay | Admitting: Family Medicine

## 2024-01-02 DIAGNOSIS — E559 Vitamin D deficiency, unspecified: Secondary | ICD-10-CM

## 2024-01-02 MED ORDER — VITAMIN D (ERGOCALCIFEROL) 1.25 MG (50000 UNIT) PO CAPS: 50000.0000 [IU] | ORAL_CAPSULE | ORAL | 1 refills | Status: AC

## 2024-01-05 ENCOUNTER — Ambulatory Visit: Payer: BC Managed Care – PPO | Admitting: Family Medicine

## 2024-01-12 ENCOUNTER — Telehealth: Payer: Self-pay | Admitting: Family Medicine

## 2024-01-14 NOTE — Telephone Encounter (Signed)
Opened in error

## 2024-01-18 ENCOUNTER — Telehealth: Payer: Self-pay | Admitting: Family Medicine

## 2024-01-18 ENCOUNTER — Other Ambulatory Visit: Payer: Self-pay | Admitting: Family Medicine

## 2024-01-18 ENCOUNTER — Encounter: Payer: Self-pay | Admitting: Family Medicine

## 2024-01-20 ENCOUNTER — Telehealth: Payer: Self-pay | Admitting: Family Medicine

## 2024-01-20 NOTE — Telephone Encounter (Signed)
Opened in error

## 2024-02-08 ENCOUNTER — Ambulatory Visit (INDEPENDENT_AMBULATORY_CARE_PROVIDER_SITE_OTHER): Payer: BC Managed Care – PPO | Admitting: Family Medicine

## 2024-02-08 ENCOUNTER — Encounter: Payer: Self-pay | Admitting: Family Medicine

## 2024-02-08 VITALS — BP 121/85 | HR 74 | Ht 79.0 in | Wt 329.0 lb

## 2024-02-08 DIAGNOSIS — Z6837 Body mass index (BMI) 37.0-37.9, adult: Secondary | ICD-10-CM

## 2024-02-08 DIAGNOSIS — R0683 Snoring: Secondary | ICD-10-CM

## 2024-02-08 DIAGNOSIS — E669 Obesity, unspecified: Secondary | ICD-10-CM

## 2024-02-08 MED ORDER — PHENTERMINE HCL 15 MG PO CAPS: 15.0000 mg | ORAL_CAPSULE | ORAL | 0 refills | Status: DC

## 2024-02-08 NOTE — Patient Instructions (Addendum)
I appreciate the opportunity to provide care to you today!    Follow up:  1 months  Start taking phentermine 15 mg daily as prescribed. I recommend implementing lifestyle changes, including following a heart-healthy diet and increasing physical activity, for optimal results in managing weight and overall health. Please monitor for side effects such as elevated blood pressure, palpitations, insomnia, dry mouth, and arrhythmias while on this treatment regimen. Contact me if you experience any of these symptoms. We will follow up monthly to assess the effectiveness of the treatment and your response to therapy.  Emphasize Lifestyle Changes: A heart-healthy diet and increased physical activity are crucial. Healthy Tips for Weight Loss: Increase Intake of Nutrient-Rich Foods: Prioritize fruits, vegetables, and whole grains. Incorporate Lean Proteins: Include chicken, fish, beans, and legumes in your diet. Choose Low-Fat Dairy Products: Opt for dairy products that are low in fat. Reduce Unhealthy Fats: Limit saturated fats, trans fatty acids, and cholesterol. Aim for Regular Physical Activity: Engage in at least 30 minutes of brisk walking or other physical activities on at least 5 days a week.    Nonpharmacological Interventions for Sleep Apnea In addition to CPAP therapy, several nonpharmacological interventions can help manage obstructive sleep apnea (OSA) and improve sleep quality:  1. Weight Loss & Healthy Diet Weight reduction can significantly reduce airway obstruction and improve symptoms.  Focus on a balanced diet rich in lean proteins, fruits, vegetables, and whole grains while reducing processed foods and sugar intake.  2. Sleep Position Modification Avoid sleeping on your back (supine position), as it can cause the tongue and soft tissues to block the airway.  Try side sleeping, which may improve airflow. Using a positional therapy device or special pillow can help maintain this  position.  3. Exercise & Physical Activity Regular physical activity (at least 150 minutes per week of moderate exercise) can improve sleep apnea severity.  Orofacial exercises (such as tongue and throat exercises) help strengthen airway muscles.  4. Avoid Alcohol & Sedatives Limit alcohol, sedatives, and muscle relaxants, as they can relax throat muscles and worsen airway obstruction.  5. Improve Sleep Hygiene  Maintain a consistent sleep schedule (same bedtime and wake-up time daily).  Avoid screen time, caffeine, and heavy meals before bed to promote restful sleep.  6. Optimize Nasal Breathing  Use nasal strips or a saline nasal spray to keep nasal passages open.  Treat allergies or nasal congestion to improve airflow.  7. Elevate the Head of the Bed Sleeping with the head elevated (using a wedge pillow or adjustable bed) can reduce airway collapse.   Referrals today- home sleep test    Please continue to a heart-healthy diet and increase your physical activities. Try to exercise for at least five days a week.    It was a pleasure to see you and I look forward to continuing to work together on your health and well-being. Please do not hesitate to call the office if you need care or have questions about your care.  In case of emergency, please visit the Emergency Department for urgent care, or contact our clinic at [phone] to schedule an appointment. We're here to help you!   Have a wonderful day and week. With Gratitude, Gilmore Laroche MSN, FNP-BC

## 2024-02-08 NOTE — Progress Notes (Unsigned)
Established Patient Office Visit  Subjective:  Patient ID: Wayne Reed, male    DOB: 05-30-1996  Age: 28 y.o. MRN: [id number]  CC:  Chief Complaint  Patient presents with   Follow-up    Follow up    HPI Wayne Reed is a 28 y.o. male with past medical history of *** presents for f/u of *** chronic medical conditions.  Past Medical History:  Diagnosis Date   Hemorrhoid    Obesity    Screening for STD (sexually transmitted disease) 01/30/2023    History reviewed. No pertinent surgical history.  Family History  Problem Relation Age of Onset   Hypertension Mother     Social History   Socioeconomic History   Marital status: Single    Spouse name: Not on file   Number of children: Not on file   Years of education: Not on file   Highest education level: 12th grade  Occupational History   Not on file  Tobacco Use   Smoking status: Never   Smokeless tobacco: Never  Vaping Use   Vaping status: Never Used  Substance and Sexual Activity   Alcohol use: Not Currently    Comment: occassional   Drug use: No   Sexual activity: Yes    Birth control/protection: None  Other Topics Concern   Not on file  Social History Narrative   Not on file   Social Drivers of Health   Financial Resource Strain: Patient Declined (12/08/2023)   Overall Financial Resource Strain (CARDIA)    Difficulty of Paying Living Expenses: Patient declined  Food Insecurity: Patient Declined (12/08/2023)   Hunger Vital Sign    Worried About Running Out of Food in the Last Year: Patient declined    Ran Out of Food in the Last Year: Patient declined  Transportation Needs: Patient Declined (12/08/2023)   PRAPARE - Administrator, Civil Service (Medical): Patient declined    Lack of Transportation (Non-Medical): Patient declined  Physical Activity: Sufficiently Active (12/08/2023)   Exercise Vital Sign    Days of Exercise per Week: 4 days    Minutes of Exercise per Session:  150+ min  Stress: Patient Declined (12/08/2023)   [employer] of Occupational Health - Occupational Stress Questionnaire    Feeling of Stress : Patient declined  Social Connections: Unknown (12/08/2023)   Social Connection and Isolation Panel [NHANES]    Frequency of Communication with Friends and Family: Patient declined    Frequency of Social Gatherings with Friends and Family: Patient declined    Attends Religious Services: Patient declined    Database administrator or Organizations: Yes    Attends Engineer, structural: More than 4 times per year    Marital Status: Patient declined  Intimate Partner Violence: Unknown (03/27/2022)   Received from [employer], [facility]   HITS    Physically Hurt: Not on file    Insult or Talk Down To: Not on file    Threaten Physical Harm: Not on file    Scream or Curse: Not on file    Outpatient Medications Prior to Visit  Medication Sig Dispense Refill   cyclobenzaprine (FLEXERIL) 5 MG tablet Take 1 tablet (5 mg total) by mouth at bedtime. 30 tablet 1   ibuprofen (ADVIL) 600 MG tablet Take 1 tablet (600 mg total) by mouth every 6 (six) hours as needed. 60 tablet 1   Semaglutide-Weight Management (WEGOVY) 0.25 MG/0.5ML SOAJ Inject 0.25 mg into the skin once a  week. 2 mL 0   Vitamin D, Ergocalciferol, (DRISDOL) 1.25 MG (50000 UNIT) CAPS capsule Take 1 capsule (50,000 Units total) by mouth every 7 (seven) days. 20 capsule 1   hydrocortisone (ANUSOL-HC) 2.5 % rectal cream Place 1 Application rectally 2 (two) times daily. (Patient not taking: Reported on 12/28/2023) 30 g 0   lidocaine (XYLOCAINE) 2 % solution Use as directed 15 mLs in the mouth or throat every 6 (six) hours as needed for mouth pain. 100 mL 0   No facility-administered medications prior to visit.    No Known Allergies  ROS Review of Systems    Objective:    Physical Exam  BP 121/85 (BP Location: Left Arm, Patient Position: Sitting, Cuff Size: Large)   Pulse  74   Ht 6\' 7"  (2.007 m)   Wt (!) 329 lb 0.6 oz (149.3 kg)   SpO2 97%   BMI 37.07 kg/m  Wt Readings from Last 3 Encounters:  02/08/24 (!) 329 lb 0.6 oz (149.3 kg)  12/28/23 (!) 329 lb (149.2 kg)  01/30/23 (!) 317 lb (143.8 kg)    Lab Results  Component Value Date   TSH 3.340 12/28/2023   Lab Results  Component Value Date   WBC 7.3 12/28/2023   HGB 14.5 12/28/2023   HCT 46.1 12/28/2023   MCV 86 12/28/2023   PLT 342 12/28/2023   Lab Results  Component Value Date   NA 138 12/28/2023   K 4.2 12/28/2023   CO2 21 12/28/2023   GLUCOSE 82 12/28/2023   BUN 11 12/28/2023   CREATININE 0.93 12/28/2023   BILITOT 0.3 12/28/2023   ALKPHOS 85 12/28/2023   AST 24 12/28/2023   ALT 31 12/28/2023   PROT 7.7 12/28/2023   ALBUMIN 4.8 12/28/2023   CALCIUM 9.8 12/28/2023   ANIONGAP 11 04/08/2018   EGFR 114 12/28/2023   Lab Results  Component Value Date   CHOL 139 12/28/2023   Lab Results  Component Value Date   HDL 38 (L) 12/28/2023   Lab Results  Component Value Date   LDLCALC 85 12/28/2023   Lab Results  Component Value Date   TRIG 84 12/28/2023   Lab Results  Component Value Date   CHOLHDL 3.7 12/28/2023   Lab Results  Component Value Date   HGBA1C 5.4 12/28/2023      Assessment & Plan:  There are no diagnoses linked to this encounter.  Follow-up: No follow-ups on file.   Gilmore Laroche, FNP

## 2024-02-11 DIAGNOSIS — R0683 Snoring: Secondary | ICD-10-CM | POA: Insufficient documentation

## 2024-02-11 NOTE — Assessment & Plan Note (Signed)
The patient reports that he has implemented lifestyle changes over the past three months with minimal changes in his weight and would like to start weight loss medication today. Phentermine 15 mg daily will be initiated. He denies any history of cardiovascular disease. The importance of maintaining lifestyle modifications was emphasized, including adherence to a heart-healthy diet and engaging in at least 150 minutes of moderate-intensity physical activity per week.  Wt Readings from Last 3 Encounters:  02/08/24 (!) 329 lb 0.6 oz (149.3 kg)  12/28/23 (!) 329 lb (149.2 kg)  01/30/23 (!) 317 lb (143.8 kg)

## 2024-02-11 NOTE — Assessment & Plan Note (Signed)
The patient complains of snoring and episodes of apnea while asleep and expresses interest in being screened for sleep apnea. Nonpharmacological interventions were reviewed, including lifestyle modifications such as weight management, positional therapy, and sleep hygiene. The patient verbalized understanding and was encouraged to follow up for further evaluation and possible sleep study if indicated.

## 2024-03-23 ENCOUNTER — Other Ambulatory Visit: Payer: Self-pay

## 2024-03-23 ENCOUNTER — Ambulatory Visit
Admission: EM | Admit: 2024-03-23 | Discharge: 2024-03-23 | Disposition: A | Discharge status: Home / Self Care | Attending: Nurse Practitioner | Admitting: Nurse Practitioner

## 2024-03-23 ENCOUNTER — Encounter: Payer: Self-pay | Admitting: Emergency Medicine

## 2024-03-23 DIAGNOSIS — R109 Unspecified abdominal pain: Secondary | ICD-10-CM

## 2024-03-23 DIAGNOSIS — R197 Diarrhea, unspecified: Secondary | ICD-10-CM | POA: Diagnosis not present

## 2024-03-23 LAB — POC COVID19/FLU A&B COMBO
Covid Antigen, POC: NEGATIVE
Influenza A Antigen, POC: NEGATIVE
Influenza B Antigen, POC: NEGATIVE

## 2024-03-23 NOTE — Discharge Instructions (Signed)
 As we discussed, your symptoms are consistent with a viral stomach bug.  Make sure you are drinking plenty of water and Pedialyte.  If you feel like eating, recommend bland diet.  Seek care emergently if you develop severe abdominal pain, nausea/vomiting and are unable to keep fluids down.

## 2024-03-23 NOTE — ED Triage Notes (Signed)
 Pt reports abdominal pain, diarrhea, nausea, chills since Sunday.

## 2024-03-23 NOTE — ED Provider Notes (Signed)
 [facility]    CSN: [id number] Arrival date & time: 03/23/24  1112      History   Chief Complaint Chief Complaint  Patient presents with   Abdominal Pain    HPI Wayne Reed is a 28 y.o. male.   Patient presents today with 3-day history of lower abdominal pain, diarrhea, nausea, and intermittent chills.  He denies fever or bodyaches, cough, congestion, sore throat, runny or stuffy nose.  Denies actually vomiting but has felt nauseous a couple of times.  Reports a few episodes of diarrhea yesterday and 2 episodes of nonbloody diarrhea so far today.  Reports stool is not pure water.  No recent foreign travel, recent antibiotic use, recent ingestion of suspicious drinking water.  No recent known undercooked food.  Reports he recently got a puppy and they were diagnosed with parvo.  Patient has taken Pepto-Bismol which did not really affect symptoms.    Past Medical History:  Diagnosis Date   Hemorrhoid    Obesity    Screening for STD (sexually transmitted disease) 01/30/2023    Patient Active Problem List   Diagnosis Date Noted   Snoring 02/11/2024   Lumbar pain 12/28/2023   Obesity (BMI 35.0-39.9 without comorbidity) 01/30/2023   Hemorrhoids 01/30/2023   Screening for STD (sexually transmitted disease) 01/30/2023    History reviewed. No pertinent surgical history.     Home Medications    Prior to Admission medications   Medication Sig Start Date End Date Taking? Authorizing Provider  phentermine 15 MG capsule Take 1 capsule (15 mg total) by mouth every morning. 02/08/24  Yes Gilmore Laroche, FNP  cyclobenzaprine (FLEXERIL) 5 MG tablet Take 1 tablet (5 mg total) by mouth at bedtime. 12/28/23   Gilmore Laroche, FNP  ibuprofen (ADVIL) 600 MG tablet Take 1 tablet (600 mg total) by mouth every 6 (six) hours as needed. 12/28/23   Gilmore Laroche, FNP  Semaglutide-Weight Management (WEGOVY) 0.25 MG/0.5ML SOAJ Inject 0.25 mg into the skin once a week. 12/28/23    Gilmore Laroche, FNP  Vitamin D, Ergocalciferol, (DRISDOL) 1.25 MG (50000 UNIT) CAPS capsule Take 1 capsule (50,000 Units total) by mouth every 7 (seven) days. 01/02/24   Gilmore Laroche, FNP    Family History Family History  Problem Relation Age of Onset   Hypertension Mother     Social History Social History   Tobacco Use   Smoking status: Never   Smokeless tobacco: Never  Vaping Use   Vaping status: Never Used  Substance Use Topics   Alcohol use: Not Currently    Comment: occassional   Drug use: No     Allergies   Patient has no known allergies.   Review of Systems Review of Systems Per HPI  Physical Exam Triage Vital Signs ED Triage Vitals  Encounter Vitals Group     BP 03/23/24 1314 136/88     Systolic BP Percentile --      Diastolic BP Percentile --      Pulse Rate 03/23/24 1314 94     Resp 03/23/24 1314 20     Temp 03/23/24 1314 98.6 F (37 C)     Temp Source 03/23/24 1314 Oral     SpO2 03/23/24 1314 96 %     Weight --      Height --      Head Circumference --      Peak Flow --      Pain Score 03/23/24 1316 4     Pain Loc --  Pain Education --      Exclude from Growth Chart --    No data found.  Updated Vital Signs BP 136/88 (BP Location: Right Arm)   Pulse 94   Temp 98.6 F (37 C) (Oral)   Resp 20   SpO2 96%   Visual Acuity Right Eye Distance:   Left Eye Distance:   Bilateral Distance:    Right Eye Near:   Left Eye Near:    Bilateral Near:     Physical Exam Vitals and nursing note reviewed.  Constitutional:      General: [name] not in acute distress.    Appearance: Normal appearance. [name] not toxic-appearing.  HENT:     Head: Normocephalic and atraumatic.     Mouth/Throat:     Mouth: Mucous membranes are moist.     Pharynx: Oropharynx is clear. No posterior oropharyngeal erythema.  Cardiovascular:     Rate and Rhythm: Normal rate and regular rhythm.  Pulmonary:     Effort: Pulmonary effort is normal. No respiratory  distress.     Breath sounds: Normal breath sounds. No wheezing, rhonchi or rales.  Abdominal:     General: Abdomen is flat. Bowel sounds are normal. There is no distension.     Palpations: Abdomen is soft.     Tenderness: There is abdominal tenderness in the right upper quadrant and right lower quadrant. There is no right CVA tenderness, left CVA tenderness, guarding or rebound. Negative signs include Murphy's sign, Rovsing's sign and McBurney's sign.  Musculoskeletal:     Cervical back: Normal range of motion.  Lymphadenopathy:     Cervical: No cervical adenopathy.  Skin:    General: Skin is warm and dry.     Capillary Refill: Capillary refill takes less than 2 seconds.     Coloration: Skin is not jaundiced or pale.     Findings: No erythema.  Neurological:     Mental Status: [name] alert and oriented to person, place, and time.     Motor: No weakness.     Gait: Gait normal.  Psychiatric:        Behavior: Behavior is cooperative.      UC Treatments / Results  Labs (all labs ordered are listed, but only abnormal results are displayed) Labs Reviewed  POC COVID19/FLU A&B COMBO    EKG   Radiology No results found.  Procedures Procedures (including critical care time)  Medications Ordered in UC Medications - No data to display  Initial Impression / Assessment and Plan / UC Course  I have reviewed the triage vital signs and the nursing notes.  Pertinent labs & imaging results that were available during my care of the patient were reviewed by me and considered in my medical decision making (see chart for details).   Patient is well-appearing, normotensive, afebrile, not tachycardic, not tachypneic, oxygenating well on room air.    1. Abdominal pain, unspecified abdominal location 2. Diarrhea, unspecified type Vitals and exam are reassuring today Viral testing negative today Suspect viral gastroenteritis Recommended bland diet, pushing hydration plenty fluids Strict  ER return precautions discussed Work excuse provided  The patient was given the opportunity to ask questions.  All questions answered to their satisfaction.  The patient is in agreement to this plan.    Final Clinical Impressions(s) / UC Diagnoses   Final diagnoses:  Abdominal pain, unspecified abdominal location  Diarrhea, unspecified type     Discharge Instructions      As we discussed,  your symptoms are consistent with a viral stomach bug.  Make sure you are drinking plenty of water and Pedialyte.  If you feel like eating, recommend bland diet.  Seek care emergently if you develop severe abdominal pain, nausea/vomiting and are unable to keep fluids down.    ED Prescriptions   None    PDMP not reviewed this encounter.   Valentino Nose, NP 03/23/24 1455

## 2024-03-24 ENCOUNTER — Ambulatory Visit (INDEPENDENT_AMBULATORY_CARE_PROVIDER_SITE_OTHER): Payer: BC Managed Care – PPO | Admitting: Family Medicine

## 2024-03-24 ENCOUNTER — Encounter: Payer: Self-pay | Admitting: Family Medicine

## 2024-03-24 VITALS — BP 116/81 | HR 90 | Resp 16 | Ht 77.0 in | Wt 322.0 lb

## 2024-03-24 DIAGNOSIS — K529 Noninfective gastroenteritis and colitis, unspecified: Secondary | ICD-10-CM

## 2024-03-24 NOTE — Patient Instructions (Signed)
 I appreciate the opportunity to provide care to you today!    -Please continue your current regimen -Stay hydrated by drinking at least 64 ounces of water daily. I recommend dietary modifications, including increasing your intake of boiled starches and cereals (e.g., potatoes, noodles, rice, wheat, and oats) with added salt. You may also consume crackers, bananas, soup, and boiled vegetables. Avoid foods high in fat until your gut function returns to normal following this episode of diarrhea.    Please continue to a heart-healthy diet and increase your physical activities. Try to exercise for at least five days a week.    It was a pleasure to see you and I look forward to continuing to work together on your health and well-being. Please do not hesitate to call the office if you need care or have questions about your care.  In case of emergency, please visit the Emergency Department for urgent care, or contact our clinic at [phone] to schedule an appointment. We're here to help you!   Have a wonderful day and week. With Gratitude, Gilmore Laroche MSN, FNP-BC

## 2024-03-24 NOTE — Progress Notes (Unsigned)
 Established Patient Office Visit  Subjective:  Patient ID: Wayne Reed, male    DOB: [birth year]  Age: 28 y.o. MRN: [id number]  CC:  Chief Complaint  Patient presents with  . Follow-up    Went to the urgent care yesterday and has been having diarrhea since Sunday and has been using gatorade but feeling really drained. Didn't prescribe anything just told him to take pepto or immodium but immodium hasn't helped much. No vomiting.     HPI Wayne Reed is a 28 y.o. male  presents with the above complaints. He reports having 3 bowel movements today with no pus, blood or mucus.      f/u of *** chronic medical conditions.  Past Medical History:  Diagnosis Date  . Hemorrhoid   . Obesity   . Screening for STD (sexually transmitted disease) 01/30/2023    No past surgical history on file.  Family History  Problem Relation Age of Onset  . Hypertension Mother     Social History   Socioeconomic History  . Marital status: Single    Spouse name: Not on file  . Number of children: Not on file  . Years of education: Not on file  . Highest education level: 12th grade  Occupational History  . Not on file  Tobacco Use  . Smoking status: Never  . Smokeless tobacco: Never  Vaping Use  . Vaping status: Never Used  Substance and Sexual Activity  . Alcohol use: Not Currently    Comment: occassional  . Drug use: No  . Sexual activity: Yes    Birth control/protection: None  Other Topics Concern  . Not on file  Social History Narrative  . Not on file   Social Drivers of Health   Financial Resource Strain: Patient Declined (12/08/2023)   Overall Financial Resource Strain (CARDIA)   . Difficulty of Paying Living Expenses: Patient declined  Food Insecurity: Patient Declined (12/08/2023)   Hunger Vital Sign   . Worried About Programme researcher, broadcasting/film/video in the Last Year: Patient declined   . Ran Out of Food in the Last Year: Patient declined  Transportation Needs: Patient  Declined (12/08/2023)   PRAPARE - Transportation   . Lack of Transportation (Medical): Patient declined   . Lack of Transportation (Non-Medical): Patient declined  Physical Activity: Sufficiently Active (12/08/2023)   Exercise Vital Sign   . Days of Exercise per Week: 4 days   . Minutes of Exercise per Session: 150+ min  Stress: Patient Declined (12/08/2023)   [employer] of Occupational Health - Occupational Stress Questionnaire   . Feeling of Stress : Patient declined  Social Connections: Unknown (12/08/2023)   Social Connection and Isolation Panel [NHANES]   . Frequency of Communication with Friends and Family: Patient declined   . Frequency of Social Gatherings with Friends and Family: Patient declined   . Attends Religious Services: Patient declined   . Active Member of Clubs or Organizations: Yes   . Attends Banker Meetings: More than 4 times per year   . Marital Status: Patient declined  Intimate Partner Violence: Unknown (03/27/2022)   Received from [facility]   HITS   . Physically Hurt: Not on file   . Insult or Talk Down To: Not on file   . Threaten Physical Harm: Not on file   . Scream or Curse: Not on file    Outpatient Medications Prior to Visit  Medication Sig Dispense Refill  . cyclobenzaprine (FLEXERIL) 5  MG tablet Take 1 tablet (5 mg total) by mouth at bedtime. 30 tablet 1  . ibuprofen (ADVIL) 600 MG tablet Take 1 tablet (600 mg total) by mouth every 6 (six) hours as needed. 60 tablet 1  . Vitamin D, Ergocalciferol, (DRISDOL) 1.25 MG (50000 UNIT) CAPS capsule Take 1 capsule (50,000 Units total) by mouth every 7 (seven) days. 20 capsule 1  . phentermine 15 MG capsule Take 1 capsule (15 mg total) by mouth every morning. (Patient not taking: Reported on 03/24/2024) 30 capsule 0  . Semaglutide-Weight Management (WEGOVY) 0.25 MG/0.5ML SOAJ Inject 0.25 mg into the skin once a week. (Patient not taking: Reported on 03/24/2024) 2 mL 0   No  facility-administered medications prior to visit.    No Known Allergies  ROS Review of Systems    Objective:    Physical Exam  BP 116/81   Pulse 90   Resp 16   Ht 6\' 5"  (1.956 m)   Wt (!) 322 lb (146.1 kg)   SpO2 98%   BMI 38.18 kg/m  Wt Readings from Last 3 Encounters:  03/24/24 (!) 322 lb (146.1 kg)  02/08/24 (!) 329 lb 0.6 oz (149.3 kg)  12/28/23 (!) 329 lb (149.2 kg)    Lab Results  Component Value Date   TSH 3.340 12/28/2023   Lab Results  Component Value Date   WBC 7.3 12/28/2023   HGB 14.5 12/28/2023   HCT 46.1 12/28/2023   MCV 86 12/28/2023   PLT 342 12/28/2023   Lab Results  Component Value Date   NA 138 12/28/2023   K 4.2 12/28/2023   CO2 21 12/28/2023   GLUCOSE 82 12/28/2023   BUN 11 12/28/2023   CREATININE 0.93 12/28/2023   BILITOT 0.3 12/28/2023   ALKPHOS 85 12/28/2023   AST 24 12/28/2023   ALT 31 12/28/2023   PROT 7.7 12/28/2023   ALBUMIN 4.8 12/28/2023   CALCIUM 9.8 12/28/2023   ANIONGAP 11 04/08/2018   EGFR 114 12/28/2023   Lab Results  Component Value Date   CHOL 139 12/28/2023   Lab Results  Component Value Date   HDL 38 (L) 12/28/2023   Lab Results  Component Value Date   LDLCALC 85 12/28/2023   Lab Results  Component Value Date   TRIG 84 12/28/2023   Lab Results  Component Value Date   CHOLHDL 3.7 12/28/2023   Lab Results  Component Value Date   HGBA1C 5.4 12/28/2023      Assessment & Plan:  There are no diagnoses linked to this encounter.  Follow-up: No follow-ups on file.   Gilmore Laroche, FNP

## 2024-03-25 DIAGNOSIS — K529 Noninfective gastroenteritis and colitis, unspecified: Secondary | ICD-10-CM | POA: Insufficient documentation

## 2024-03-25 NOTE — Assessment & Plan Note (Signed)
 Encouraged the patient to continue his current regimen. Advised maintaining adequate hydration by drinking at least 64 ounces of water daily. Recommended dietary modifications, including increasing intake of boiled starches and cereals (e.g., potatoes, noodles, rice, wheat, and oats) with added salt. Encouraged consumption of crackers, bananas, soup, and boiled vegetables. Advised avoiding high-fat foods until gastrointestinal function returns to normal following this episode of diarrhea.

## 2024-04-03 ENCOUNTER — Ambulatory Visit
Admission: EM | Admit: 2024-04-03 | Discharge: 2024-04-03 | Disposition: A | Discharge status: Home / Self Care | Attending: Internal Medicine | Admitting: Internal Medicine

## 2024-04-03 DIAGNOSIS — J029 Acute pharyngitis, unspecified: Secondary | ICD-10-CM | POA: Insufficient documentation

## 2024-04-03 DIAGNOSIS — J069 Acute upper respiratory infection, unspecified: Secondary | ICD-10-CM | POA: Diagnosis present

## 2024-04-03 LAB — POC COVID19/FLU A&B COMBO
Covid Antigen, POC: NEGATIVE
Influenza A Antigen, POC: NEGATIVE
Influenza B Antigen, POC: NEGATIVE

## 2024-04-03 LAB — POCT RAPID STREP A (OFFICE): Rapid Strep A Screen: NEGATIVE

## 2024-04-03 NOTE — Discharge Instructions (Signed)

## 2024-04-03 NOTE — ED Triage Notes (Signed)
 Pt reports nasal congestion, x 2 days, sore throat, congestion, coughing up mucus with a little blood, body aches, diarrhea.

## 2024-04-03 NOTE — ED Provider Notes (Signed)
 [facility]    CSN: [id number] Arrival date & time: 04/03/24  1417      History   Chief Complaint No chief complaint on file.   HPI Wayne Reed is a 28 y.o. male.   Wayne Reed is a 28 y.o. male presenting for chief complaint of sore throat, nasal congestion, body aches, and diarrhea that started 2 days ago.  He denies cough.  Sore throat is worsened by swallowing.  He has had a few episodes of nonbloody diarrhea.  Reports tinges of blood in his mucus from his nose, denies blood clots coming from the nose and epistaxis.  Denies recent fevers, chills, headache, ear pain, dizziness, abdominal pain, nausea, vomiting, and constipation.  He does not have any cough and denies shortness of breath/chest pain.No recent rashes or sick contacts with similar symptoms.  Denies recent antibiotic or steroid use in the last 90 days.    Taking over-the-counter medications for symptoms with some relief.     Past Medical History:  Diagnosis Date   Hemorrhoid    Obesity    Screening for STD (sexually transmitted disease) 01/30/2023    Patient Active Problem List   Diagnosis Date Noted   Gastroenteritis 03/25/2024   Snoring 02/11/2024   Lumbar pain 12/28/2023   Obesity (BMI 35.0-39.9 without comorbidity) 01/30/2023   Hemorrhoids 01/30/2023   Screening for STD (sexually transmitted disease) 01/30/2023    History reviewed. No pertinent surgical history.     Home Medications    Prior to Admission medications   Medication Sig Start Date End Date Taking? Authorizing Provider  cyclobenzaprine (FLEXERIL) 5 MG tablet Take 1 tablet (5 mg total) by mouth at bedtime. 12/28/23   Zarwolo, Gloria, FNP  ibuprofen (ADVIL) 600 MG tablet Take 1 tablet (600 mg total) by mouth every 6 (six) hours as needed. 12/28/23   Zarwolo, Gloria, FNP  phentermine 15 MG capsule Take 1 capsule (15 mg total) by mouth every morning. Patient not taking: Reported on 03/24/2024 02/08/24   Zarwolo, Gloria,  FNP  Semaglutide-Weight Management (WEGOVY) 0.25 MG/0.5ML SOAJ Inject 0.25 mg into the skin once a week. Patient not taking: Reported on 03/24/2024 12/28/23   Zarwolo, Gloria, FNP  Vitamin D, Ergocalciferol, (DRISDOL) 1.25 MG (50000 UNIT) CAPS capsule Take 1 capsule (50,000 Units total) by mouth every 7 (seven) days. 01/02/24   Zarwolo, Gloria, FNP    Family History Family History  Problem Relation Age of Onset   Hypertension Mother     Social History Social History   Tobacco Use   Smoking status: Never   Smokeless tobacco: Never  Vaping Use   Vaping status: Never Used  Substance Use Topics   Alcohol use: Not Currently    Comment: occassional   Drug use: No     Allergies   Patient has no known allergies.   Review of Systems Review of Systems   Physical Exam Triage Vital Signs ED Triage Vitals  Encounter Vitals Group     BP 04/03/24 1429 (!) 150/89     Systolic BP Percentile --      Diastolic BP Percentile --      Pulse Rate 04/03/24 1429 92     Resp 04/03/24 1429 18     Temp 04/03/24 1429 98.2 F (36.8 C)     Temp Source 04/03/24 1429 Oral     SpO2 04/03/24 1429 96 %     Weight --      Height --  Head Circumference --      Peak Flow --      Pain Score 04/03/24 1432 7     Pain Loc --      Pain Education --      Exclude from Growth Chart --    No data found.  Updated Vital Signs BP (!) 150/89 (BP Location: Right Arm)   Pulse 92   Temp 98.2 F (36.8 C) (Oral)   Resp 18   SpO2 96%   Visual Acuity Right Eye Distance:   Left Eye Distance:   Bilateral Distance:    Right Eye Near:   Left Eye Near:    Bilateral Near:     Physical Exam Vitals and nursing note reviewed.  Constitutional:      Appearance: [name] not ill-appearing or toxic-appearing.  HENT:     Head: Normocephalic and atraumatic.     Right Ear: Hearing, tympanic membrane, ear canal and external ear normal.     Left Ear: Hearing, tympanic membrane, ear canal and external ear normal.      Nose: Congestion present.     Mouth/Throat:     Lips: Pink.     Mouth: Mucous membranes are moist. No injury or oral lesions.     Dentition: Normal dentition.     Tongue: No lesions.     Pharynx: Oropharynx is clear. Uvula midline. Posterior oropharyngeal erythema present. No pharyngeal swelling, oropharyngeal exudate, uvula swelling or postnasal drip.     Tonsils: No tonsillar exudate.  Eyes:     General: Lids are normal. Vision grossly intact. Gaze aligned appropriately.     Extraocular Movements: Extraocular movements intact.     Conjunctiva/sclera: Conjunctivae normal.  Neck:     Trachea: Trachea and phonation normal.  Cardiovascular:     Rate and Rhythm: Normal rate and regular rhythm.     Heart sounds: Normal heart sounds, S1 normal and S2 normal.  Pulmonary:     Effort: Pulmonary effort is normal. No respiratory distress.     Breath sounds: Normal breath sounds and air entry. No wheezing, rhonchi or rales.  Chest:     Chest wall: No tenderness.  Musculoskeletal:     Cervical back: Neck supple.  Lymphadenopathy:     Cervical: Cervical adenopathy present.  Skin:    General: Skin is warm and dry.     Capillary Refill: Capillary refill takes less than 2 seconds.     Findings: No rash.  Neurological:     General: No focal deficit present.     Mental Status: [name] alert and oriented to person, place, and time. Mental status is at baseline.     Cranial Nerves: No dysarthria or facial asymmetry.  Psychiatric:        Mood and Affect: Mood normal.        Speech: Speech normal.        Behavior: Behavior normal.        Thought Content: Thought content normal.        Judgment: Judgment normal.      UC Treatments / Results  Labs (all labs ordered are listed, but only abnormal results are displayed) Labs Reviewed  POC COVID19/FLU A&B COMBO    EKG   Radiology No results found.  Procedures Procedures (including critical care time)  Medications Ordered in  UC Medications - No data to display  Initial Impression / Assessment and Plan / UC Course  I have reviewed the triage vital signs and the nursing  notes.  Pertinent labs & imaging results that were available during my care of the patient were reviewed by me and considered in my medical decision making (see chart for details).   1. Viral URI, viral pharyngitis Suspect viral URI, viral syndrome.  Strep/viral testing: POC COVID and flu are negative.  Group A strep testing is negative.  Throat culture pending.  Physical exam findings reassuring, vital signs hemodynamically stable, and lungs clear, therefore deferred imaging of the chest.  Advised supportive care/prescriptions for symptomatic relief as outlined in AVS.    Counseled patient on potential for adverse effects with medications prescribed/recommended today, strict ER and return-to-clinic precautions discussed, patient verbalized understanding.   Final Clinical Impressions(s) / UC Diagnoses   Final diagnoses:  Viral URI  Viral pharyngitis     Discharge Instructions      You have a viral illness which will improve on its own with rest, fluids, and medications to help with your symptoms.  Tylenol, guaifenesin (plain mucinex), and saline nasal sprays may help relieve symptoms.   Two teaspoons of honey in 1 cup of warm water every 4-6 hours may help with throat pains.  Humidifier in room at nighttime may help soothe cough (clean well daily).   For chest pain, shortness of breath, inability to keep food or fluids down without vomiting, fever that does not respond to tylenol or motrin, or any other severe symptoms, please go to the ER for further evaluation. Return to urgent care as needed, otherwise follow-up with PCP.       ED Prescriptions   None    PDMP not reviewed this encounter.   Starlene Eaton, [place] 04/03/24 1459

## 2024-04-04 ENCOUNTER — Emergency Department (HOSPITAL_COMMUNITY): Admission: EM | Admit: 2024-04-04 | Discharge: 2024-04-04 | Discharge status: Against Medical Advice

## 2024-04-04 NOTE — ED Notes (Signed)
 Pt decided wait was to long and he wanted to go home, says he will come back if needed,

## 2024-04-06 LAB — CULTURE, GROUP A STREP (THRC)

## 2024-04-07 ENCOUNTER — Encounter: Payer: Self-pay | Admitting: Family Medicine

## 2024-04-07 ENCOUNTER — Ambulatory Visit (INDEPENDENT_AMBULATORY_CARE_PROVIDER_SITE_OTHER): Admitting: Family Medicine

## 2024-04-07 VITALS — BP 134/85 | HR 90 | Resp 18 | Ht 78.0 in | Wt 320.0 lb

## 2024-04-07 DIAGNOSIS — B349 Viral infection, unspecified: Secondary | ICD-10-CM

## 2024-04-07 DIAGNOSIS — K529 Noninfective gastroenteritis and colitis, unspecified: Secondary | ICD-10-CM

## 2024-04-07 MED ORDER — PROMETHAZINE-DM 6.25-15 MG/5ML PO SYRP: 5.0000 mL | ORAL_SOLUTION | Freq: Four times a day (QID) | ORAL | 0 refills | Status: DC | PRN

## 2024-04-07 NOTE — Patient Instructions (Addendum)
 I appreciate the opportunity to provide care to you today!   Viral Illness: I recommend symptomatic treatments with the following: Start taking Promethazine DM 5 mL by mouth every 4 hours as needed for cough and cold symptoms. Increase fluid intake and allow for plenty of rest. Take Tylenol as needed for pain, fever, or general discomfort. Perform warm saltwater gargles 3-4 times daily to help with throat pain or discomfort. (Mix 1/2 teaspoon of salt in a glass of warm water and gargle several times daily to reduce throat inflammation and soothe irritation.) Ginger tea: Reduces throat irritation and can help with nausea. Look for sugar-free or honey-based throat lozenges to help with a sore throat. Use a humidifier at bedtime to help with cough and nasal congestion. For nasal congestion: The use of heated humidified air is a safe and effective therapy. Saline nasal sprays may also help alleviate nasal symptoms of the common cold. For cough: Treatment with honey may reduce cough frequency and severity. Follow up if your symptoms do not improve after 7 to 10 days of symptom onset.   Diarrhea Management: Please return the GI stool kit at your earliest convenience to help determine the underlying cause of your diarrhea. Provide a stool sample for further evaluation. Stay well hydrated by drinking at least 64 ounces of water daily. Dietary Recommendations: Increase intake of boiled starches and cereals (e.g., potatoes, noodles, rice, wheat, oats) with added salt. You may also consume crackers, bananas, soup, and boiled vegetables. Avoid high-fat foods until your gastrointestinal function returns to normal. Seek urgent care immediately if you experience: Fever or chills Blood, pus, or mucus in the stool Severe nausea, vomiting, or persistent diarrhea    Please continue to a heart-healthy diet and increase your physical activities. Try to exercise for at least five days a week.    It  was a pleasure to see you and I look forward to continuing to work together on your health and well-being. Please do not hesitate to call the office if you need care or have questions about your care.  In case of emergency, please visit the Emergency Department for urgent care, or contact our clinic at [phone] to schedule an appointment. We're here to help you!   Have a wonderful day and week. With Gratitude, Kenley Rettinger MSN, FNP-BC

## 2024-04-07 NOTE — Assessment & Plan Note (Signed)
 Encouraged to return the GI stool kit to help determine the underlying cause of your diarrhea. Encouraged to stay well hydrated by drinking at least 64 ounces of water daily. Dietary Recommendations: Encouraged to increase intake of boiled starches and cereals (e.g., potatoes, noodles, rice, wheat, oats) with added salt. Encouraged to consume crackers, bananas, soup, and boiled vegetables. Encouraged to avoid high-fat foods until your gastrointestinal function returns to normal. Advised to seek urgent care immediately if he experience: Fever or chills Blood, pus, or mucus in the stool Severe nausea, vomiting, or persistent diarrhea

## 2024-04-07 NOTE — Progress Notes (Signed)
 Acute Office Visit  Subjective:    Patient ID: Wayne Reed, male    DOB: [birth year], 28 y.o.   MRN: [id number]  Chief Complaint  Patient presents with   Diarrhea    Diarrhea persists for last couple weeks. Denies fever and vomiting. Was seen in urgent care for this on 04/02 for this.    Cough    Non productive cough for last couple weeks. Was seen in urgent care on 04/13 for this and diagnosed with viral uri. Still no better     HPI The patient presents today with complaints of diarrhea that initially began on 03/24/2024, then subsided, but restarted on April 01, 2024. He reports having 2-3 loose stools per day. He denies blood, pus, mucus, fever, nausea, vomiting, or severe abdominal pain. The patient was evaluated at urgent care on April 03, 2024, where he was diagnosed with a viral upper respiratory infection. A respiratory panel was negative for COVID-19, influenza, and Group A Streptococcus.    Past Medical History:  Diagnosis Date   Hemorrhoid    Obesity    Screening for STD (sexually transmitted disease) 01/30/2023    History reviewed. No pertinent surgical history.  Family History  Problem Relation Age of Onset   Hypertension Mother     Social History   Socioeconomic History   Marital status: Single    Spouse name: Not on file   Number of children: Not on file   Years of education: Not on file   Highest education level: 12th grade  Occupational History   Not on file  Tobacco Use   Smoking status: Never   Smokeless tobacco: Never  Vaping Use   Vaping status: Never Used  Substance and Sexual Activity   Alcohol use: Not Currently    Comment: occassional   Drug use: No   Sexual activity: Yes    Birth control/protection: None  Other Topics Concern   Not on file  Social History Narrative   Not on file   Social Drivers of Health   Financial Resource Strain: Patient Declined (12/08/2023)   Overall Financial Resource Strain (CARDIA)    Difficulty of  Paying Living Expenses: Patient declined  Food Insecurity: Patient Declined (12/08/2023)   Hunger Vital Sign    Worried About Running Out of Food in the Last Year: Patient declined    Ran Out of Food in the Last Year: Patient declined  Transportation Needs: Patient Declined (12/08/2023)   PRAPARE - Administrator, Civil Service (Medical): Patient declined    Lack of Transportation (Non-Medical): Patient declined  Physical Activity: Sufficiently Active (12/08/2023)   Exercise Vital Sign    Days of Exercise per Week: 4 days    Minutes of Exercise per Session: 150+ min  Stress: Patient Declined (12/08/2023)   [employer] of Occupational Health - Occupational Stress Questionnaire    Feeling of Stress : Patient declined  Social Connections: Unknown (12/08/2023)   Social Connection and Isolation Panel [NHANES]    Frequency of Communication with Friends and Family: Patient declined    Frequency of Social Gatherings with Friends and Family: Patient declined    Attends Religious Services: Patient declined    Active Member of Clubs or Organizations: Yes    Attends Banker Meetings: More than 4 times per year    Marital Status: Patient declined  Intimate Partner Violence: Unknown (03/27/2022)   Received from [employer], [facility]   HITS    Physically Hurt: Not  on file    Insult or Talk Down To: Not on file    Threaten Physical Harm: Not on file    Scream or Curse: Not on file    Outpatient Medications Prior to Visit  Medication Sig Dispense Refill   cyclobenzaprine (FLEXERIL) 5 MG tablet Take 1 tablet (5 mg total) by mouth at bedtime. 30 tablet 1   ibuprofen (ADVIL) 600 MG tablet Take 1 tablet (600 mg total) by mouth every 6 (six) hours as needed. 60 tablet 1   Vitamin D, Ergocalciferol, (DRISDOL) 1.25 MG (50000 UNIT) CAPS capsule Take 1 capsule (50,000 Units total) by mouth every 7 (seven) days. 20 capsule 1   phentermine 15 MG capsule Take 1 capsule (15  mg total) by mouth every morning. (Patient not taking: Reported on 04/07/2024) 30 capsule 0   Semaglutide-Weight Management (WEGOVY) 0.25 MG/0.5ML SOAJ Inject 0.25 mg into the skin once a week. (Patient not taking: Reported on 04/07/2024) 2 mL 0   No facility-administered medications prior to visit.    No Known Allergies  Review of Systems  Constitutional:  Negative for fatigue and fever.  HENT:  Positive for sneezing.   Eyes:  Negative for visual disturbance.  Respiratory:  Positive for cough. Negative for chest tightness and shortness of breath.   Cardiovascular:  Negative for chest pain and palpitations.  Gastrointestinal:  Positive for diarrhea.  Neurological:  Negative for dizziness and headaches.       Objective:    Physical Exam HENT:     Head: Normocephalic.     Right Ear: External ear normal.     Left Ear: External ear normal.     Nose: No congestion or rhinorrhea.     Mouth/Throat:     Mouth: Mucous membranes are moist.  Cardiovascular:     Rate and Rhythm: Regular rhythm.     Heart sounds: No murmur heard. Pulmonary:     Effort: No respiratory distress.     Breath sounds: Normal breath sounds.  Abdominal:     Tenderness: There is no abdominal tenderness. There is no right CVA tenderness, left CVA tenderness, guarding or rebound.     Hernia: No hernia is present.  Neurological:     Mental Status: [name] alert.     BP 134/85   Pulse 90   Resp 18   Ht 6\' 6"  (1.981 m)   Wt (!) 320 lb 0.6 oz (145.2 kg)   SpO2 97%   BMI 36.98 kg/m  Wt Readings from Last 3 Encounters:  04/07/24 (!) 320 lb 0.6 oz (145.2 kg)  03/24/24 (!) 322 lb (146.1 kg)  02/08/24 (!) 329 lb 0.6 oz (149.3 kg)       Assessment & Plan:  Gastroenteritis Assessment & Plan: Encouraged to return the GI stool kit to help determine the underlying cause of your diarrhea. Encouraged to stay well hydrated by drinking at least 64 ounces of water daily. Dietary Recommendations: Encouraged to  increase intake of boiled starches and cereals (e.g., potatoes, noodles, rice, wheat, oats) with added salt. Encouraged to consume crackers, bananas, soup, and boiled vegetables. Encouraged to avoid high-fat foods until your gastrointestinal function returns to normal. Advised to seek urgent care immediately if he experience: Fever or chills Blood, pus, or mucus in the stool Severe nausea, vomiting, or persistent diarrhea  Orders: -     GI Profile, Stool, PCR  Viral illness Assessment & Plan: I recommend symptomatic treatments with the following: Start taking Promethazine DM 5 mL  by mouth every 4 hours as needed for cough and cold symptoms. Increase fluid intake and allow for plenty of rest. Take Tylenol as needed for pain, fever, or general discomfort. Perform warm saltwater gargles 3-4 times daily to help with throat pain or discomfort. (Mix 1/2 teaspoon of salt in a glass of warm water and gargle several times daily to reduce throat inflammation and soothe irritation.) Ginger tea: Reduces throat irritation and can help with nausea. Look for sugar-free or honey-based throat lozenges to help with a sore throat. Use a humidifier at bedtime to help with cough and nasal congestion. For nasal congestion: The use of heated humidified air is a safe and effective therapy. Saline nasal sprays may also help alleviate nasal symptoms of the common cold. For cough: Treatment with honey may reduce cough frequency and severity. Follow up if your symptoms do not improve after 7 to 10 days of symptom onset.   Orders: -     Promethazine-DM; Take 5 mLs by mouth 4 (four) times daily as needed.  Dispense: 118 mL; Refill: 0    Gypsy Kellogg, FNP

## 2024-04-07 NOTE — Assessment & Plan Note (Signed)
 I recommend symptomatic treatments with the following: Start taking Promethazine DM 5 mL by mouth every 4 hours as needed for cough and cold symptoms. Increase fluid intake and allow for plenty of rest. Take Tylenol as needed for pain, fever, or general discomfort. Perform warm saltwater gargles 3-4 times daily to help with throat pain or discomfort. (Mix 1/2 teaspoon of salt in a glass of warm water and gargle several times daily to reduce throat inflammation and soothe irritation.) Ginger tea: Reduces throat irritation and can help with nausea. Look for sugar-free or honey-based throat lozenges to help with a sore throat. Use a humidifier at bedtime to help with cough and nasal congestion. For nasal congestion: The use of heated humidified air is a safe and effective therapy. Saline nasal sprays may also help alleviate nasal symptoms of the common cold. For cough: Treatment with honey may reduce cough frequency and severity. Follow up if your symptoms do not improve after 7 to 10 days of symptom onset.

## 2024-04-14 LAB — GI PROFILE, STOOL, PCR

## 2024-04-19 NOTE — Progress Notes (Signed)
 Would you please find out why his stool test was canceled?

## 2024-04-19 NOTE — Progress Notes (Signed)
 No new sample is needed if he is feeling better; however, if he is still having diarrhea, a new sample can be collected. will try to have the forms completed by Friday.

## 2024-04-20 ENCOUNTER — Other Ambulatory Visit: Payer: Self-pay | Admitting: Family Medicine

## 2024-04-20 DIAGNOSIS — B349 Viral infection, unspecified: Secondary | ICD-10-CM

## 2024-04-21 ENCOUNTER — Telehealth: Payer: Self-pay

## 2024-04-21 NOTE — Telephone Encounter (Signed)
 Do you have his form?  He states it is due today and its not at the front desk. Pls advise

## 2024-04-21 NOTE — Telephone Encounter (Signed)
 Completed and handed to Abby.

## 2024-04-21 NOTE — Telephone Encounter (Signed)
 Spoke with patient yesterday have a stool sample kit ready for him for when he comes and get his forms. Please call patient and update on forms they are due today, dropped off at last appoointment.

## 2024-04-21 NOTE — Telephone Encounter (Signed)
 Copied from CRM [id number]. Topic: General - Other >> Apr 20, 2024  4:10 PM Star East wrote: Reason for CRM: Patient calling to find out why stool sample test was cancelled and also needs an update on FMLA paperwork needs to turn it in by 5/1- please call [phone]

## 2024-04-21 NOTE — Telephone Encounter (Signed)
 See result note. This was addressed with patient

## 2024-05-17 ENCOUNTER — Ambulatory Visit: Payer: Self-pay

## 2024-05-17 NOTE — Telephone Encounter (Signed)
 Copied from CRM [id number]. Topic: Clinical - Red Word Triage >> May 17, 2024 11:07 AM Juluis Ok wrote: [facility] that prompted transfer to Nurse Triage: abdominal pain, constant diarrhea.   Chief Complaint: Abdominal Pain/Diarrhea/Gas Symptoms: pain, diarrhea Frequency: 1-2 months ago everything initially started but now off and on episodes of diarrhea/stomach cramping---this episode has lasted a week Pertinent Negatives: Patient denies vomiting, fever, blood in stools, pain with urination, blood in urine,  Disposition: [] ED /[] Urgent Care (no appt availability in office) / [] Appointment(In office/virtual)/ []  [facility]/ [] Home Care/ [x] Refused Recommended Disposition /[]  Mobile Bus/ []  Follow-up with PCP Additional Notes: Patient and called and advised that he has been dealing with episodes of diarrhea.  It has been random episodes of diarrhea. This episode has lasted about a week. Patient attempted to leave a stool sample but it was cancelled due to being over filled. Patient states that this has been an issues for about 1-2 months and he was wondering if Irritable Bowel Syndrome could be a possibility. He states that he would like advice from his PCP on what steps to take next.  He was wondering if his PCP wanted him to try and provider another stool sample with a new order or if she wanted to see him back in the office. He states he has been trying to stay hydrated. He has taken some Kaopectate that helps some but then he states he goes right back to having diarrhea. Patient is advised that if something changes and he wants to make an appointment to call us  back and let us  know. Patient is also given Care Advice as per protocol & is advised that if anything worsens to go to the Emergency Room or call 911 if needed. Patient verbalized understanding.     Reason for Disposition  Abdominal pain is a chronic symptom (recurrent or ongoing AND present > 4 weeks)  Answer  Assessment - Initial Assessment Questions 1. LOCATION: "Where does it hurt?"      Around navel 2. RADIATION: "Does the pain shoot anywhere else?" (e.g., chest, back)     ---- 3. ONSET: "When did the pain begin?" (Minutes, hours or days ago)      1 week ago for this episode 4. SUDDEN: "Gradual or sudden onset?"     ---- 5. PATTERN "Does the pain come and go, or is it constant?"    - If it comes and goes: "How long does it last?" "Do you have pain now?"     (Note: Comes and goes means the pain is intermittent. It goes away completely between bouts.)    - If constant: "Is it getting better, staying the same, or getting worse?"      (Note: Constant means the pain never goes away completely; most serious pain is constant and gets worse.)      Off and on 6. SEVERITY: "How bad is the pain?"  (e.g., Scale 1-10; mild, moderate, or severe)    - MILD (1-3): Doesn't interfere with normal activities, abdomen soft and not tender to touch.     - MODERATE (4-7): Interferes with normal activities or awakens from sleep, abdomen tender to touch.     - SEVERE (8-10): Excruciating pain, doubled over, unable to do any normal activities.       5 7. RECURRENT SYMPTOM: "Have you ever had this type of stomach pain before?" If Yes, ask: "When was the last time?" and "What happened that time?"  Yes--pcp appts 8. CAUSE: "What do you think is causing the stomach pain?"     unknown 9. RELIEVING/AGGRAVATING FACTORS: "What makes it better or worse?" (e.g., antacids, bending or twisting motion, bowel movement)     Kaopectate helps some 10. OTHER SYMPTOMS: "Do you have any other symptoms?" (e.g., back pain, diarrhea, fever, urination pain, vomiting)       diarrhea  Protocols used: Abdominal Pain - Male-A-AH

## 2024-05-18 ENCOUNTER — Telehealth: Payer: Self-pay

## 2024-05-18 ENCOUNTER — Other Ambulatory Visit: Payer: Self-pay | Admitting: Family Medicine

## 2024-05-18 DIAGNOSIS — R197 Diarrhea, unspecified: Secondary | ICD-10-CM

## 2024-05-18 NOTE — Telephone Encounter (Signed)
 See previous encounter, message sent to PCP for review

## 2024-05-18 NOTE — Telephone Encounter (Signed)
 Copied from CRM [id number]. Topic: Clinical - Red Word Triage >> May 17, 2024 11:07 AM Juluis Ok wrote: [facility] that prompted transfer to Nurse Triage: abdominal pain, constant diarrhea. >> May 18, 2024  2:15 PM DeAngela L wrote: Pt called back about the GI concern and would like to know what additional information could be advised to him and his call back number is pt num [phone]

## 2024-05-19 NOTE — Telephone Encounter (Signed)
 Pt informed via [employer].

## 2024-05-23 ENCOUNTER — Encounter (INDEPENDENT_AMBULATORY_CARE_PROVIDER_SITE_OTHER): Payer: Self-pay | Admitting: *Deleted

## 2024-05-25 ENCOUNTER — Encounter: Payer: Self-pay | Admitting: *Deleted

## 2024-05-25 ENCOUNTER — Other Ambulatory Visit: Payer: Self-pay | Admitting: *Deleted

## 2024-05-25 ENCOUNTER — Encounter (INDEPENDENT_AMBULATORY_CARE_PROVIDER_SITE_OTHER): Payer: Self-pay | Admitting: Gastroenterology

## 2024-05-25 ENCOUNTER — Ambulatory Visit (INDEPENDENT_AMBULATORY_CARE_PROVIDER_SITE_OTHER): Admitting: Gastroenterology

## 2024-05-25 VITALS — BP 118/74 | HR 79 | Temp 98.1°F | Ht 78.0 in | Wt 318.9 lb

## 2024-05-25 DIAGNOSIS — R933 Abnormal findings on diagnostic imaging of other parts of digestive tract: Secondary | ICD-10-CM

## 2024-05-25 DIAGNOSIS — Z6837 Body mass index (BMI) 37.0-37.9, adult: Secondary | ICD-10-CM | POA: Diagnosis not present

## 2024-05-25 DIAGNOSIS — K529 Noninfective gastroenteritis and colitis, unspecified: Secondary | ICD-10-CM | POA: Diagnosis not present

## 2024-05-25 DIAGNOSIS — K909 Intestinal malabsorption, unspecified: Secondary | ICD-10-CM | POA: Insufficient documentation

## 2024-05-25 DIAGNOSIS — R935 Abnormal findings on diagnostic imaging of other abdominal regions, including retroperitoneum: Secondary | ICD-10-CM | POA: Insufficient documentation

## 2024-05-25 MED ORDER — PEG 3350-KCL-NA BICARB-NACL 420 G PO SOLR: 4000.0000 mL | Freq: Once | ORAL | 0 refills | Status: AC

## 2024-05-25 NOTE — Patient Instructions (Signed)
 It was very nice to meet you today, as dicussed with will plan for the following :  1) colonoscopy  2) Ensure adequate fluid intake: Aim for 8 glasses of water daily. Follow a high fiber diet: Include foods such as dates, prunes, pears, and kiwi. Use Metamucil twice a day.  3) Labwork and stool samples , 3 of them

## 2024-05-25 NOTE — Progress Notes (Signed)
 Nichlas Pitera Faizan Kjell Brannen , M.D. Gastroenterology & Hepatology [facility] [address] [place], [place] [address] Primary Care Physician: Zarwolo, Gloria, FNP [address] [place] [address]  Chief Complaint: Chronic diarrhea,   History of Present Illness: Wayne Reed is a 28 y.o. male no significant medical issues who presents for evaluation of Chronic diarrhea  Patient complaints of diarrhea beginning of April after upper respiratory infection.  Since then his bowel movement has not been regular having 3-5 bowel movements daily..  Patient reports " hemorrhoids" and had blood seen few years ago which self resolved.  Patient does report diffuse abdominal discomfort without any aggravating or relieving factor.  Recently patient has been waking up in the middle of the night defecating 2 through 3 times daily.  The patient denies having any nausea, vomiting, fever, chills,  melena, hematemesis, abdominal distention,jaundice, pruritus or weight loss.  Last RCV:ELFY Last Colonoscopy:none  FHx: neg for any gastrointestinal/liver disease, no malignancies Social: neg smoking, alcohol or illicit drug use Surgical: no abdominal surgeries  Lab work from January 2025 normal liver enzymes hemoglobin 14.5 platelet 342 hemoglobin A1c 5.9 TSH 3.3 Past Medical History: Past Medical History:  Diagnosis Date   Hemorrhoid    Obesity    Screening for STD (sexually transmitted disease) 01/30/2023    Past Surgical History:History reviewed. No pertinent surgical history.  Family History: Family History  Problem Relation Age of Onset   Hypertension Mother     Social History: Social History   Tobacco Use  Smoking Status Never  Smokeless Tobacco Never   Social History   Substance and Sexual Activity  Alcohol Use Yes   Comment: occassional   Social History   Substance and Sexual Activity  Drug Use No    Allergies: No Known  Allergies  Medications: Current Outpatient Medications  Medication Sig Dispense Refill   cyclobenzaprine  (FLEXERIL ) 5 MG tablet Take 1 tablet (5 mg total) by mouth at bedtime. 30 tablet 1   ibuprofen  (ADVIL ) 600 MG tablet Take 1 tablet (600 mg total) by mouth every 6 (six) hours as needed. 60 tablet 1   Vitamin D , Ergocalciferol , (DRISDOL ) 1.25 MG (50000 UNIT) CAPS capsule Take 1 capsule (50,000 Units total) by mouth every 7 (seven) days. 20 capsule 1   No current facility-administered medications for this visit.    Review of Systems: GENERAL: negative for malaise, night sweats HEENT: No changes in hearing or vision, no nose bleeds or other nasal problems. NECK: Negative for lumps, goiter, pain and significant neck swelling RESPIRATORY: Negative for cough, wheezing CARDIOVASCULAR: Negative for chest pain, leg swelling, palpitations, orthopnea GI: SEE HPI MUSCULOSKELETAL: Negative for joint pain or swelling, back pain, and muscle pain. SKIN: Negative for lesions, rash HEMATOLOGY Negative for prolonged bleeding, bruising easily, and swollen nodes. ENDOCRINE: Negative for cold or heat intolerance, polyuria, polydipsia and goiter. NEURO: negative for tremor, gait imbalance, syncope and seizures. The remainder of the review of systems is noncontributory.   Physical Exam: BP 118/74   Pulse 79   Temp 98.1 F (36.7 C)   Ht 6\' 6"  (1.981 m)   Wt (!) 318 lb 14.4 oz (144.7 kg)   BMI 36.85 kg/m  GENERAL: The patient is AO x3, in no acute distress. HEENT: Head is normocephalic and atraumatic. EOMI are intact. Mouth is well hydrated and without lesions. NECK: Supple. No masses LUNGS: Clear to auscultation. No presence of rhonchi/wheezing/rales. Adequate chest expansion HEART: RRR, normal s1 and s2.  ABDOMEN: Soft, nontender, no guarding, no peritoneal signs, and nondistended. BS +. No masses.   Imaging/Labs: as above     Latest Ref Rng & Units 12/28/2023    3:05 PM 02/02/2023   10:51  AM 04/08/2018   12:15 PM  CBC  WBC 3.4 - 10.8 x10E3/uL 7.3  7.3  14.3   Hemoglobin 13.0 - 17.7 g/dL 11.9  14.7  82.9   Hematocrit 37.5 - 51.0 % 46.1  46.2  43.9   Platelets 150 - 450 x10E3/uL 342  326  266    No results found for: "IRON", "TIBC", "FERRITIN"  I personally reviewed and interpreted the available labs, imaging and endoscopic files.  2018 CT Abdomen   IMPRESSION: 1. Slightly enlarged appendix up to 9 mm but not much surrounding inflammation, clinical correlation is recommended. There are prominent right lower quadrant mesenteric lymph nodes nonspecific but could be seen with adenitis 2. Questionable mild rectal wall thickening, query proctitis. No other significant bowel wall thickening is evident  Impression and Plan:  Wayne Reed is a 28 y.o. male no significant medical issues who presents for evaluation of Chronic diarrhea  #Chronic Diarrhea  Patient has more than 3 loose stools for more than 4 weeks hence qualifies for chronic diarrhea  This could be Postinfectious IBS or irritable bowel syndrome diarrhea type , need to rule out inflammatory disease  There is no correlation with food intake and hence patient is recommended keeping a food diary  Patient diet is low in fiber and recommended Metamucil starting 1 scoop daily for 1 week followed by 2 scoops daily second week, and 3 scoops daily thereafter, to bulk up stool  Patient does have nocturnal symptoms ,would wake up in the middle of the night to defecate to 3 times which is an alarm symptom and CT in 2018 with  questionable rectal wall thickening with history of hematochezia , reasonable to proceed with colonoscopy to exclude inflammatory bowel disease  Ileo-Colonsocopy to evaluate for Ct finiding of rectal wall thickening , diarrhea and history of painless hematochezia and exclude IBD  We will obtain blood work including fecal calprotectin, check for celiac disease with TTG IgA and total IgA, and  obtain stool studies  #BMI 37      - walking at a brisk pace/biking at moderate intesity 2.5-5 hours per week     - use pedometer/step counter to track activity     - goal to lose 5-10% of initial body weight     - avoid suagry drinks and juices, use zero calorie beverages     - increase water intake     - eat a low carb diet with plenty of veggies and fruit     - Get sufficient sleep 7-8 hrs nightly     - maitain active lifestyle     - avoid alcohol     - recommend 2-3 cups Coffee daily     - Counsel on lowering cholesterol by having a diet rich in vegetables,          protein (avoid red meats) and good fats(fish, salmon).       All questions were answered.      Kayra Crowell Faizan Rusell Meneely, MD Gastroenterology and Hepatology [facility] Gastroenterology   This chart has been completed using [facility] Dictation software, and while attempts have been made to ensure accuracy , certain words and phrases may not be transcribed as intended

## 2024-06-09 LAB — C-REACTIVE PROTEIN: CRP: 1 mg/L (ref 0–10)

## 2024-06-10 LAB — GI PROFILE, STOOL, PCR

## 2024-06-11 LAB — CALPROTECTIN, FECAL: Calprotectin, Fecal: 94 ug/g (ref 0–120)

## 2024-06-12 LAB — ALPHA-GAL PANEL
Allergen Lamb IgE: <0.1 kU/L
Beef IgE: <0.1 kU/L
IgE (Immunoglobulin E), Serum: 35 [IU]/mL (ref 6–495)
O215-IgE Alpha-Gal: <0.1 kU/L
Pork IgE: <0.1 kU/L

## 2024-06-12 LAB — CELIAC AB TTG DGP TIGA
Antigliadin Abs, IgA: 4 U (ref 0–19)
Gliadin IgG: 2 U (ref 0–19)
IgA/Immunoglobulin A, Serum: 228 mg/dL (ref 90–386)
Tissue Transglut Ab: 5 U/mL (ref 0–5)

## 2024-06-12 LAB — FERRITIN: Ferritin: 267 ng/mL (ref 30–400)

## 2024-06-12 LAB — C DIFFICILE, CYTOTOXIN B

## 2024-06-12 LAB — C DIFFICILE TOXINS A+B W/RFLX: C difficile Toxins A+B, EIA: NEGATIVE

## 2024-06-14 ENCOUNTER — Ambulatory Visit (INDEPENDENT_AMBULATORY_CARE_PROVIDER_SITE_OTHER): Payer: Self-pay | Admitting: Gastroenterology

## 2024-06-14 NOTE — Progress Notes (Signed)
 Labs  Ferritin : 267  Normal IgA and negative celiac panel  Negative Alpha Gal  CRP :1  Negative C.Diff Fecal Calpro: 94 ( negative)  GI PCR negative

## 2024-06-17 ENCOUNTER — Ambulatory Visit
Admission: RE | Admit: 2024-06-17 | Discharge: 2024-06-17 | Disposition: A | Discharge status: Home / Self Care | Source: Ambulatory Visit | Attending: Family Medicine | Admitting: Family Medicine

## 2024-06-17 VITALS — BP 139/98 | HR 82 | Temp 98.7°F | Resp 18

## 2024-06-17 DIAGNOSIS — H65192 Other acute nonsuppurative otitis media, left ear: Secondary | ICD-10-CM | POA: Diagnosis not present

## 2024-06-17 MED ORDER — PSEUDOEPHEDRINE HCL 60 MG PO TABS
60.0000 mg | ORAL_TABLET | Freq: Three times a day (TID) | ORAL | 0 refills | Status: AC | PRN
Start: 2024-06-17 — End: ?

## 2024-06-17 MED ORDER — AZELASTINE HCL 0.1 % NA SOLN: 1.0000 | Freq: Two times a day (BID) | NASAL | 0 refills | Status: DC

## 2024-06-17 NOTE — Discharge Instructions (Addendum)
 In addition to the prescribed medications, you may take an antihistamine like Zyrtec daily to help with seasonal allergies.  Follow-up for significantly worsening symptoms.

## 2024-06-17 NOTE — ED Triage Notes (Signed)
 Pt reports left ear pain, throat irritation, pain feels deep in the left ear.

## 2024-06-17 NOTE — ED Provider Notes (Signed)
 [facility]    CSN: [id number] Arrival date & time: 06/17/24  1600      History   Chief Complaint Chief Complaint  Patient presents with   Ear Fullness    Inner ear pain / throat pain - Entered by patient    HPI Wayne Reed is a 28 y.o. male.   Patient presenting today with several day history of left ear pain and pressure, throat irritation and postnasal drainage.  Denies significant nasal congestion, fever, chills, difficulty breathing or swallowing, chest pain, shortness of breath.  So far not trying anything over-the-counter for symptoms.  No known history of pertinent chronic medical problems.    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 No pertinent surgical history.     Home Medications    Prior to Admission medications   Medication Sig Start Date End Date Taking? Authorizing Provider  azelastine (ASTELIN) 0.1 % nasal spray Place 1 spray into both nostrils 2 (two) times daily. Use in each nostril as directed 06/17/24  Yes Stuart Vernell Norris, PA-C  pseudoephedrine (SUDAFED) 60 MG tablet Take 1 tablet (60 mg total) by mouth every 8 (eight) hours as needed for congestion. 06/17/24  Yes Stuart Vernell Norris, PA-C  cyclobenzaprine  (FLEXERIL ) 5 MG tablet Take 1 tablet (5 mg total) by mouth at bedtime. 12/28/23   Zarwolo, Gloria, FNP  ibuprofen  (ADVIL ) 600 MG tablet Take 1 tablet (600 mg total) by mouth every 6 (six) hours as  needed. 12/28/23   Zarwolo, Gloria, FNP  Vitamin D , Ergocalciferol , (DRISDOL ) 1.25 MG (50000 UNIT) CAPS capsule Take 1 capsule (50,000 Units total) by mouth every 7 (seven) days. 01/02/24   Zarwolo, Gloria, FNP    Family History Family History  Problem Relation Age of Onset   Hypertension Mother     Social History Social History   Tobacco Use   Smoking status: Never   Smokeless tobacco: Never  Vaping Use   Vaping status: Never Used  Substance Use Topics   Alcohol use: Yes    Comment: occassional   Drug use: No     Allergies   Patient has no known allergies.  Review of Systems Review of Systems PER HPI  Physical Exam Triage Vital Signs ED Triage Vitals  Encounter Vitals Group     BP 06/17/24 1606 (!) 139/98     Girls Systolic BP Percentile --      Girls Diastolic BP Percentile --      Boys Systolic BP Percentile --      Boys Diastolic BP Percentile --      Pulse Rate 06/17/24 1606 82     Resp 06/17/24 1606 18     Temp 06/17/24 1606 98.7 F (37.1 C)     Temp Source 06/17/24 1606 Oral     SpO2 06/17/24 1606 98 %     Weight --      Height --      Head Circumference --  Peak Flow --      Pain Score 06/17/24 1609 8     Pain Loc --      Pain Education --      Exclude from Growth Chart --    No data found.  Updated Vital Signs BP (!) 139/98 (BP Location: Right Arm)   Pulse 82   Temp 98.7 F (37.1 C) (Oral)   Resp 18   SpO2 98%   Visual Acuity Right Eye Distance:   Left Eye Distance:   Bilateral Distance:    Right Eye Near:   Left Eye Near:    Bilateral Near:     Physical Exam Vitals and nursing note reviewed.  Constitutional:      Appearance: [name] well-developed.  HENT:     Head: Atraumatic.     Right Ear: External ear normal.     Left Ear: External ear normal.     Ears:     Comments: Left middle ear effusion    Nose: Nose normal.     Mouth/Throat:     Mouth: Mucous membranes are moist.     Pharynx: Oropharynx is clear. Posterior  oropharyngeal erythema present. No oropharyngeal exudate.     Comments: Cobblestoning the posterior oropharynx  Eyes:     Conjunctiva/sclera: Conjunctivae normal.     Pupils: Pupils are equal, round, and reactive to light.    Cardiovascular:     Rate and Rhythm: Normal rate and regular rhythm.  Pulmonary:     Effort: Pulmonary effort is normal. No respiratory distress.     Breath sounds: No wheezing or rales.   Musculoskeletal:        General: Normal range of motion.     Cervical back: Normal range of motion and neck supple.  Lymphadenopathy:     Cervical: No cervical adenopathy.   Skin:    General: Skin is warm and dry.   Neurological:     Mental Status: [name] alert and oriented to person, place, and time.   Psychiatric:        Behavior: Behavior normal.      UC Treatments / Results  Labs (all labs ordered are listed, but only abnormal results are displayed) Labs Reviewed - No data to display  EKG   Radiology No results found.  Procedures Procedures (including critical care time)  Medications Ordered in UC Medications - No data to display  Initial Impression / Assessment and Plan / UC Course  I have reviewed the triage vital signs and the nursing notes.  Pertinent labs & imaging results that were available during my care of the patient were reviewed by me and considered in my medical decision making (see chart for details).     Vitals and exam reassuring today, suspect middle ear effusion possibly related to seasonal allergies.  Treat with Zyrtec, Astelin, Sudafed as needed, supportive over-the-counter medications and home care.  Return for worsening symptoms.  Final Clinical Impressions(s) / UC Diagnoses   Final diagnoses:  Acute MEE (middle ear effusion), left     Discharge Instructions      In addition to the prescribed medications, you may take an antihistamine like Zyrtec daily to help with seasonal allergies.  Follow-up for significantly  worsening symptoms.    ED Prescriptions     Medication Sig Dispense Auth. Provider   azelastine (ASTELIN) 0.1 % nasal spray Place 1 spray into both nostrils 2 (two) times daily. Use in each nostril as directed 30 mL Stuart Vernell Norris,  PA-C   pseudoephedrine (SUDAFED) 60 MG tablet Take 1 tablet (60 mg total) by mouth every 8 (eight) hours as needed for congestion. 15 tablet Stuart Vernell Norris, [place]      PDMP not reviewed this encounter.   Stuart Vernell Norris, [place] 06/17/24 1701

## 2024-06-27 ENCOUNTER — Ambulatory Visit (HOSPITAL_COMMUNITY): Admitting: Anesthesiology

## 2024-06-27 ENCOUNTER — Encounter (HOSPITAL_COMMUNITY)
Admission: RE | Disposition: A | Discharge status: Home / Self Care | Payer: Self-pay | Source: Home / Self Care | Attending: Gastroenterology

## 2024-06-27 ENCOUNTER — Encounter (HOSPITAL_COMMUNITY): Payer: Self-pay | Admitting: Gastroenterology

## 2024-06-27 ENCOUNTER — Encounter (INDEPENDENT_AMBULATORY_CARE_PROVIDER_SITE_OTHER): Payer: Self-pay | Admitting: *Deleted

## 2024-06-27 ENCOUNTER — Other Ambulatory Visit: Payer: Self-pay

## 2024-06-27 ENCOUNTER — Ambulatory Visit (HOSPITAL_COMMUNITY)
Admission: RE | Admit: 2024-06-27 | Discharge: 2024-06-27 | Disposition: A | Discharge status: Home / Self Care | Attending: Gastroenterology | Admitting: Gastroenterology

## 2024-06-27 DIAGNOSIS — K6389 Other specified diseases of intestine: Secondary | ICD-10-CM | POA: Insufficient documentation

## 2024-06-27 DIAGNOSIS — K529 Noninfective gastroenteritis and colitis, unspecified: Secondary | ICD-10-CM | POA: Diagnosis present

## 2024-06-27 DIAGNOSIS — K648 Other hemorrhoids: Secondary | ICD-10-CM | POA: Diagnosis not present

## 2024-06-27 HISTORY — PX: COLONOSCOPY: SHX5424

## 2024-06-27 LAB — HM COLONOSCOPY

## 2024-06-27 SURGERY — COLONOSCOPY
Anesthesia: General

## 2024-06-27 MED ORDER — PROPOFOL 10 MG/ML IV BOLUS
INTRAVENOUS | Status: DC | PRN
  Administered 2024-06-27: 100 mg via INTRAVENOUS

## 2024-06-27 MED ORDER — PROPOFOL 500 MG/50ML IV EMUL
INTRAVENOUS | Status: DC | PRN
  Administered 2024-06-27: 200 ug/kg/min via INTRAVENOUS

## 2024-06-27 MED ORDER — DEXMEDETOMIDINE HCL IN NACL 80 MCG/20ML IV SOLN
INTRAVENOUS | Status: DC | PRN
  Administered 2024-06-27: 20 ug via INTRAVENOUS

## 2024-06-27 MED ORDER — BENEFIBER DRINK MIX PO PACK: 4.0000 g | PACK | Freq: Every day | ORAL | Status: AC

## 2024-06-27 MED ORDER — LACTATED RINGERS IV SOLN: INTRAVENOUS | Status: DC

## 2024-06-27 NOTE — Anesthesia Postprocedure Evaluation (Signed)
 Anesthesia Post Note  Patient: Wayne Reed  Procedure(s) Performed: COLONOSCOPY  Patient location during evaluation: Phase II Anesthesia Type: General Level of consciousness: awake Pain management: pain level controlled Vital Signs Assessment: post-procedure vital signs reviewed and stable Respiratory status: spontaneous breathing and respiratory function stable Cardiovascular status: blood pressure returned to baseline and stable Postop Assessment: no headache and no apparent nausea or vomiting Anesthetic complications: no Comments: Late entry   No notable events documented.   Last Vitals:  Vitals:   06/27/24 0854 06/27/24 0859  BP: (!) 107/53 129/88  Pulse: 80 73  Resp: 17 (!) 22  Temp: 36.7 C   SpO2: 95% 98%    Last Pain:  Vitals:   06/27/24 0859  TempSrc:   PainSc: 0-No pain                 Wayne Reed

## 2024-06-27 NOTE — Anesthesia Preprocedure Evaluation (Signed)
 Anesthesia Evaluation  Patient identified by MRN, date of birth, ID band Patient awake    Reviewed: Allergy & Precautions, H&P , NPO status , Patient's Chart, lab work & pertinent test results, reviewed documented beta blocker date and time   Airway Mallampati: II  TM Distance: >3 FB Neck ROM: full    Dental no notable dental hx.    Pulmonary neg pulmonary ROS   Pulmonary exam normal breath sounds clear to auscultation       Cardiovascular Exercise Tolerance: Good hypertension, negative cardio ROS  Rhythm:regular Rate:Normal     Neuro/Psych negative neurological ROS  negative psych ROS   GI/Hepatic negative GI ROS, Neg liver ROS,,,  Endo/Other    Class 3 obesity  Renal/GU negative Renal ROS  negative genitourinary   Musculoskeletal   Abdominal   Peds  Hematology negative hematology ROS (+)   Anesthesia Other Findings   Reproductive/Obstetrics negative OB ROS                              Anesthesia Physical Anesthesia Plan  ASA: 2  Anesthesia Plan: General   Post-op Pain Management:    Induction:   PONV Risk Score and Plan: Propofol  infusion  Airway Management Planned:   Additional Equipment:   Intra-op Plan:   Post-operative Plan:   Informed Consent: I have reviewed the patients History and Physical, chart, labs and discussed the procedure including the risks, benefits and alternatives for the proposed anesthesia with the patient or authorized representative who has indicated his/her understanding and acceptance.     Dental Advisory Given  Plan Discussed with: CRNA  Anesthesia Plan Comments:         Anesthesia Quick Evaluation

## 2024-06-27 NOTE — Op Note (Signed)
 [facility] Patient Name: Wayne Reed Procedure Date: 06/27/2024 8:21 AM MRN: [id number] Date of Birth: 1996/03/30 Attending MD: Deatrice Dine , MD, [id number] CSN: [id number] Age: 28 Admit Type: Outpatient Procedure:                Colonoscopy Indications:              Chronic diarrhea, Hematochezia, Abnormal CT of the                            GI tract Providers:                Deatrice Dine, MD, Rosina Sprague, Crystal Page,                            Bascom Blush Referring MD:              Medicines:                Monitored Anesthesia Care Complications:            No immediate complications. Estimated Blood Loss:     Estimated blood loss: none. Procedure:                Pre-Anesthesia Assessment:                           - Prior to the procedure, a History and Physical                            was performed, and patient medications and                            allergies were reviewed. The patient's tolerance of                            previous anesthesia was also reviewed. The risks                            and benefits of the procedure and the sedation                            options and risks were discussed with the patient.                            All questions were answered, and informed consent                            was obtained. Prior Anticoagulants: The patient has                            taken no anticoagulant or antiplatelet agents. ASA                            Grade Assessment: II - A patient with mild systemic  disease. After reviewing the risks and benefits,                            the patient was deemed in satisfactory condition to                            undergo the procedure.                           After obtaining informed consent, the colonoscope                            was passed under direct vision. Throughout the                            procedure, the patient's blood pressure,  pulse, and                            oxygen saturations were monitored continuously. The                            [id number]) scope was introduced through the                            anus and advanced to the the terminal ileum. The                            colonoscopy was performed without difficulty. The                            patient tolerated the procedure well. The quality                            of the bowel preparation was evaluated using the                            BBPS [facility] Bowel Preparation Scale) with scores                            of: Right Colon = 3, Transverse Colon = 3 and Left                            Colon = 3 (entire mucosa seen well with no residual                            staining, small fragments of stool or opaque                            liquid). The total BBPS score equals 9. The                            terminal ileum, ileocecal valve, appendiceal  orifice, and rectum were photographed. Scope In: 8:34:23 AM Scope Out: 8:49:06 AM Scope Withdrawal Time: 0 hours 11 minutes 14 seconds  Total Procedure Duration: 0 hours 14 minutes 43 seconds  Findings:      The perianal and digital rectal examinations were normal.      An area of mucosa in the terminal ileum was nodular. Biopsies were taken       with a cold forceps for histology.      There is no endoscopic evidence of inflammation in the entire colon.       Biopsies were taken with a cold forceps for histology.      Non-bleeding internal hemorrhoids were found during retroflexion. The       hemorrhoids were small. Impression:               - Nodular ileal mucosa. Biopsied.                           - Non-bleeding internal hemorrhoids. Moderate Sedation:      Per Anesthesia Care Recommendation:           - Patient has a contact number available for                            emergencies. The signs and symptoms of potential                             delayed complications were discussed with the                            patient. Return to normal activities tomorrow.                            Written discharge instructions were provided to the                            patient.                           - High fiber diet.                           - Continue present medications.                           - Await pathology results.                           - Repeat colonoscopy at age 36 for screening                            purposes.                           - Return to GI clinic as previously scheduled. Procedure Code(s):        --- Professional ---                           [phone], Colonoscopy, flexible; with  biopsy, single                            or multiple Diagnosis Code(s):        --- Professional ---                           K63.89, Other specified diseases of intestine                           K64.8, Other hemorrhoids                           K52.9, Noninfective gastroenteritis and colitis,                            unspecified                           K92.1, Melena (includes Hematochezia)                           R93.3, Abnormal findings on diagnostic imaging of                            other parts of digestive tract CPT copyright 2022 American Medical Association. All rights reserved. The codes documented in this report are preliminary and upon coder review may  be revised to meet current compliance requirements. Deatrice Dine, MD Deatrice Dine, MD 06/27/2024 8:58:59 AM This report has been signed electronically. Number of Addenda: 0

## 2024-06-27 NOTE — H&P (Signed)
 Primary Care Physician:  Zarwolo, Gloria, FNP Primary Gastroenterologist:  Dr. Cinderella  Pre-Procedure History & Physical: HPI:  Wayne Reed is a 28 y.o. male is here for a ileo-colonoscopy for evaluation of diarrhea.  Patient denies any family history of colorectal cancer.  No melena or hematochezia.  No abdominal pain or unintentional weight loss.   Ferritin : 267  Normal IgA and negative celiac panel  Negative Alpha Gal  CRP :1  Negative C.Diff Fecal Calpro: 94 ( negative)  GI PCR negative  Patient complaints of diarrhea beginning of April after upper respiratory infection.  Since then his bowel movement has not been regular having 3-5 bowel movements daily..  Patient reports  hemorrhoids and had blood seen few years ago which self resolved.  Patient does report diffuse abdominal discomfort without any aggravating or relieving factor.  Recently patient has been waking up in the middle of the night defecating 2 through 3 times daily.  The patient denies having any nausea, vomiting, fever, chills,  melena, hematemesis, abdominal distention,jaundice, pruritus or weight loss.   Last ZHI:wnwz Last Colonoscopy:none   FHx: neg for any gastrointestinal/liver disease, no malignancies Social: neg smoking, alcohol or illicit drug use Surgical: no abdominal surgeries   Past Medical History:  Diagnosis Date   Hemorrhoid    Obesity    Screening for STD (sexually transmitted disease) 01/30/2023    History reviewed. No pertinent surgical history.  Prior to Admission medications   Medication Sig Start Date End Date Taking? Authorizing Provider  azelastine  (ASTELIN ) 0.1 % nasal spray Place 1 spray into both nostrils 2 (two) times daily. Use in each nostril as directed 06/17/24  Yes Stuart Vernell Norris, PA-C  pseudoephedrine  (SUDAFED) 60 MG tablet Take 1 tablet (60 mg total) by mouth every 8 (eight) hours as needed for congestion. 06/17/24  Yes Stuart Vernell Norris, PA-C   cyclobenzaprine  (FLEXERIL ) 5 MG tablet Take 1 tablet (5 mg total) by mouth at bedtime. 12/28/23   Zarwolo, Gloria, FNP  ibuprofen  (ADVIL ) 600 MG tablet Take 1 tablet (600 mg total) by mouth every 6 (six) hours as needed. 12/28/23   Zarwolo, Gloria, FNP  Vitamin D , Ergocalciferol , (DRISDOL ) 1.25 MG (50000 UNIT) CAPS capsule Take 1 capsule (50,000 Units total) by mouth every 7 (seven) days. 01/02/24   Zarwolo, Gloria, FNP    Allergies as of 05/25/2024   (No Known Allergies)    Family History  Problem Relation Age of Onset   Hypertension Mother     Social History   Socioeconomic History   Marital status: Single    Spouse name: Not on file   Number of children: Not on file   Years of education: Not on file   Highest education level: 12th grade  Occupational History   Not on file  Tobacco Use   Smoking status: Never   Smokeless tobacco: Never  Vaping Use   Vaping status: Never Used  Substance and Sexual Activity   Alcohol use: Yes    Comment: occassional   Drug use: No   Sexual activity: Yes    Birth control/protection: None  Other Topics Concern   Not on file  Social History Narrative   Not on file   Social Drivers of Health   Financial Resource Strain: Patient Declined (12/08/2023)   Overall Financial Resource Strain (CARDIA)    Difficulty of Paying Living Expenses: Patient declined  Food Insecurity: Patient Declined (12/08/2023)   Hunger Vital Sign    Worried About Radiation protection practitioner of Food  in the Last Year: Patient declined    Ran Out of Food in the Last Year: Patient declined  Transportation Needs: Patient Declined (12/08/2023)   PRAPARE - Administrator, Civil Service (Medical): Patient declined    Lack of Transportation (Non-Medical): Patient declined  Physical Activity: Sufficiently Active (12/08/2023)   Exercise Vital Sign    Days of Exercise per Week: 4 days    Minutes of Exercise per Session: 150+ min  Stress: Patient Declined (12/08/2023)   [employer] - Occupational Stress Questionnaire    Feeling of Stress : Patient declined  Social Connections: Unknown (12/08/2023)   Social Connection and Isolation Panel    Frequency of Communication with Friends and Family: Patient declined    Frequency of Social Gatherings with Friends and Family: Patient declined    Attends Religious Services: Patient declined    Active Member of Clubs or Organizations: Yes    Attends Banker Meetings: More than 4 times per year    Marital Status: Patient declined  Intimate Partner Violence: Unknown (03/27/2022)   Received from [facility]   HITS    Physically Hurt: Not on file    Insult or Talk Down To: Not on file    Threaten Physical Harm: Not on file    Scream or Curse: Not on file    Review of Systems: See HPI, otherwise negative ROS  Physical Exam: Vital signs in last 24 hours: Temp:  [98 F (36.7 C)] 98 F (36.7 C) (07/07 0723) Pulse Rate:  [81] 81 (07/07 0723) Resp:  [16] 16 (07/07 0723) BP: (139)/(94) 139/94 (07/07 0723) SpO2:  [97 %] 97 % (07/07 0723) Weight:  [144.7 kg] 144.7 kg (07/07 0723)   General:   Alert,  Well-developed, well-nourished, pleasant and cooperative in NAD Head:  Normocephalic and atraumatic. Eyes:  Sclera clear, no icterus.   Conjunctiva pink. Ears:  Normal auditory acuity. Nose:  No deformity, discharge,  or lesions. Msk:  Symmetrical without gross deformities. Normal posture. Extremities:  Without clubbing or edema. Neurologic:  Alert and  oriented x4;  grossly normal neurologically. Skin:  Intact without significant lesions or rashes. Psych:  Alert and cooperative. Normal mood and affect.  Impression/Plan: Wayne Reed is here for a ileo-colonoscopy to be performed forCt finiding of rectal wall thickening , diarrhea and history of painless hematochezia and exclude IBD   The risks of the procedure including infection, bleed, or perforation as well as benefits,  limitations, alternatives and imponderables have been reviewed with the patient. Questions have been answered. All parties agreeable.

## 2024-06-27 NOTE — Discharge Instructions (Signed)

## 2024-06-27 NOTE — Transfer of Care (Signed)
 Immediate Anesthesia Transfer of Care Note  Patient: Wayne Reed  Procedure(s) Performed: COLONOSCOPY  Patient Location: Endoscopy Unit  Anesthesia Type:General  Level of Consciousness: awake, alert , oriented, and patient cooperative  Airway & Oxygen Therapy: Patient Spontanous Breathing  Post-op Assessment: Report given to RN, Post -op Vital signs reviewed and stable, and Patient moving all extremities X 4  Post vital signs: Reviewed and stable  Last Vitals:  Vitals Value Taken Time  BP 107/53 06/27/24 08:54  Temp 36.7 C 06/27/24 08:54  Pulse 80 06/27/24 08:54  Resp 17 06/27/24 08:54  SpO2 95 % 06/27/24 08:54    Last Pain:  Vitals:   06/27/24 0854  TempSrc: Axillary  PainSc:       Patients Stated Pain Goal: 5 (06/27/24 0723)  Complications: No notable events documented.

## 2024-06-28 ENCOUNTER — Encounter (HOSPITAL_COMMUNITY): Payer: Self-pay | Admitting: Gastroenterology

## 2024-06-28 ENCOUNTER — Encounter (INDEPENDENT_AMBULATORY_CARE_PROVIDER_SITE_OTHER): Payer: Self-pay | Admitting: *Deleted

## 2024-06-29 LAB — SURGICAL PATHOLOGY

## 2024-06-30 ENCOUNTER — Ambulatory Visit (INDEPENDENT_AMBULATORY_CARE_PROVIDER_SITE_OTHER): Payer: Self-pay | Admitting: Gastroenterology

## 2024-07-05 NOTE — Progress Notes (Signed)
 Patient result letter mailed Patient's PCP is on EPIC

## 2024-09-17 ENCOUNTER — Ambulatory Visit
Admission: RE | Admit: 2024-09-17 | Discharge: 2024-09-17 | Disposition: A | Discharge status: Home / Self Care | Source: Ambulatory Visit | Attending: Internal Medicine | Admitting: Internal Medicine

## 2024-09-17 VITALS — BP 133/84 | HR 97 | Temp 99.0°F | Resp 20

## 2024-09-17 DIAGNOSIS — J069 Acute upper respiratory infection, unspecified: Secondary | ICD-10-CM

## 2024-09-17 LAB — POC SOFIA SARS ANTIGEN FIA: SARS Coronavirus 2 Ag: NEGATIVE

## 2024-09-17 NOTE — ED Triage Notes (Signed)
 Pt reports he has chills, sweats,  sore throat, body ahces, diarrhea, and mucus x 2 days   Too dayquil, ibuprofen , and halls which gave some relief.

## 2024-09-17 NOTE — Discharge Instructions (Signed)

## 2024-09-17 NOTE — ED Provider Notes (Signed)
 [facility]    CSN: [id number] Arrival date & time: 09/17/24  1413      History   Chief Complaint Chief Complaint  Patient presents with   Chills    Entered by patient    HPI Wayne Reed is a 28 y.o. male.   Wayne Reed is a 28 y.o. male presenting for chief complaint of sore throat, body aches, cold chills, and generalized fatigue that started 2 days ago on September 15, 2024.  No recent sick contacts with similar symptoms.  Denies cough, nausea, vomiting, diarrhea, abdominal pain, rash, and dizziness.  He has not checked his temperature at home.  Denies recent antibiotic or steroid use.  Denies history of asthma/chronic respiratory problems, never smoker.  Taking over-the-counter medications with some relief.     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   BMI 37.0-37.9, adult 05/25/2024   Abnormal CT of the abdomen 05/25/2024   Viral illness 04/07/2024   Gastroenteritis 03/25/2024   Snoring 02/11/2024   Lumbar pain 12/28/2023   Obesity (BMI 35.0-39.9 without comorbidity) 01/30/2023   Hemorrhoids 01/30/2023   Screening for STD (sexually transmitted disease) 01/30/2023    Past Surgical History:  Procedure Laterality Date   COLONOSCOPY N/A 06/27/2024   Procedure: COLONOSCOPY;  Surgeon: Cinderella Deatrice FALCON, MD;  Location: [facility];  Service: Endoscopy;  Laterality: N/A;  2:00 pm, asa 1/2       Home Medications    Prior to Admission medications   Medication Sig Start Date End Date Taking? Authorizing Provider  azelastine  (ASTELIN ) 0.1 % nasal spray Place 1 spray into both nostrils 2 (two) times daily. Use in each nostril as directed 06/17/24   Stuart Vernell Norris, PA-C  cyclobenzaprine  (FLEXERIL ) 5 MG tablet Take 1 tablet (5 mg total) by mouth at  bedtime. 12/28/23   Zarwolo, Gloria, FNP  pseudoephedrine  (SUDAFED) 60 MG tablet Take 1 tablet (60 mg total) by mouth every 8 (eight) hours as needed for congestion. 06/17/24   Stuart Vernell Norris, PA-C  Vitamin D , Ergocalciferol , (DRISDOL ) 1.25 MG (50000 UNIT) CAPS capsule Take 1 capsule (50,000 Units total) by mouth every 7 (seven) days. 01/02/24   Zarwolo, Gloria, FNP  Wheat Dextrin (BENEFIBER DRINK MIX) PACK Take 4 g by mouth at bedtime. 06/27/24   Ahmed, Deatrice FALCON, MD    Family History Family History  Problem Relation Age of Onset   Hypertension Mother     Social History Social History   Tobacco Use   Smoking status: Never   Smokeless tobacco: Never  Vaping Use   Vaping status: Never Used  Substance Use Topics   Alcohol use: Yes    Comment: occassional   Drug use: No     Allergies   Patient has no known allergies.   Review of Systems Review of Systems Per HPI  Physical Exam Triage Vital Signs ED Triage Vitals  Encounter Vitals Group     BP 09/17/24 1423 133/84     Girls Systolic BP Percentile --      Girls Diastolic BP Percentile --      Boys Systolic BP Percentile --      Boys Diastolic BP Percentile --      Pulse Rate 09/17/24 1423 97     Resp 09/17/24 1423  20     Temp 09/17/24 1423 99 F (37.2 C)     Temp Source 09/17/24 1423 Oral     SpO2 09/17/24 1423 97 %     Weight --      Height --      Head Circumference --      Peak Flow --      Pain Score 09/17/24 1425 5     Pain Loc --      Pain Education --      Exclude from Growth Chart --    No data found.  Updated Vital Signs BP 133/84 (BP Location: Right Arm)   Pulse 97   Temp 99 F (37.2 C) (Oral)   Resp 20   SpO2 97%   Visual Acuity Right Eye Distance:   Left Eye Distance:   Bilateral Distance:    Right Eye Near:   Left Eye Near:    Bilateral Near:     Physical Exam Vitals and nursing note reviewed.  Constitutional:      Appearance: [name] not ill-appearing or toxic-appearing.   HENT:     Head: Normocephalic and atraumatic.     Right Ear: Hearing, tympanic membrane, ear canal and external ear normal.     Left Ear: Hearing, tympanic membrane, ear canal and external ear normal.     Nose: Nose normal.     Mouth/Throat:     Lips: Pink.     Mouth: Mucous membranes are moist. No injury or oral lesions.     Dentition: Normal dentition.     Tongue: No lesions.     Pharynx: Oropharynx is clear. Uvula midline. No pharyngeal swelling, oropharyngeal exudate, posterior oropharyngeal erythema, uvula swelling or postnasal drip.     Tonsils: No tonsillar exudate.  Eyes:     General: Lids are normal. Vision grossly intact. Gaze aligned appropriately.     Extraocular Movements: Extraocular movements intact.     Conjunctiva/sclera: Conjunctivae normal.  Neck:     Trachea: Trachea and phonation normal.  Cardiovascular:     Rate and Rhythm: Normal rate and regular rhythm.     Heart sounds: Normal heart sounds, S1 normal and S2 normal.  Pulmonary:     Effort: Pulmonary effort is normal. No respiratory distress.     Breath sounds: Normal breath sounds and air entry.  Musculoskeletal:     Cervical back: Neck supple.  Lymphadenopathy:     Cervical: No cervical adenopathy.  Skin:    General: Skin is warm and dry.     Capillary Refill: Capillary refill takes less than 2 seconds.     Findings: No rash.  Neurological:     General: No focal deficit present.     Mental Status: [name] alert and oriented to person, place, and time. Mental status is at baseline.     Cranial Nerves: No dysarthria or facial asymmetry.  Psychiatric:        Mood and Affect: Mood normal.        Speech: Speech normal.        Behavior: Behavior normal.        Thought Content: Thought content normal.        Judgment: Judgment normal.      UC Treatments / Results  Labs (all labs ordered are listed, but only abnormal results are displayed) Labs Reviewed  POC SOFIA SARS ANTIGEN FIA     EKG   Radiology No results found.  Procedures Procedures (including critical care time)  Medications Ordered in UC Medications - No data to display  Initial Impression / Assessment and Plan / UC Course  I have reviewed the triage vital signs and the nursing notes.  Pertinent labs & imaging results that were available during my care of the patient were reviewed by me and considered in my medical decision making (see chart for details).   1.  Viral URI with cough Suspect viral URI, viral syndrome.  Strep/viral testing: POC COVID testing negative.   Physical exam findings reassuring, vital signs hemodynamically stable, and lungs clear, therefore deferred imaging of the chest.  Advised supportive care/prescriptions for symptomatic relief as outlined in AVS.    Counseled patient on potential for adverse effects with medications prescribed/recommended today, strict ER and return-to-clinic precautions discussed, patient verbalized understanding.    Final Clinical Impressions(s) / UC Diagnoses   Final diagnoses:  Viral URI with cough     Discharge Instructions      You have a viral illness which will improve on its own with rest, fluids, and medications to help with your symptoms.  Tylenol , guaifenesin  (plain mucinex ), and saline nasal sprays may help relieve symptoms.   Two teaspoons of honey in 1 cup of warm water every 4-6 hours may help with throat pains.  Humidifier in room at nighttime may help soothe cough (clean well daily).   For chest pain, shortness of breath, inability to keep food or fluids down without vomiting, fever that does not respond to tylenol  or motrin , or any other severe symptoms, please go to the ER for further evaluation. Return to urgent care as needed, otherwise follow-up with PCP.      ED Prescriptions   None    PDMP not reviewed this encounter.   Enedelia Dorna HERO, [place] 09/17/24 1446

## 2024-09-19 ENCOUNTER — Telehealth: Admitting: Family Medicine

## 2024-09-19 DIAGNOSIS — B9689 Other specified bacterial agents as the cause of diseases classified elsewhere: Secondary | ICD-10-CM

## 2024-09-19 DIAGNOSIS — J019 Acute sinusitis, unspecified: Secondary | ICD-10-CM | POA: Diagnosis not present

## 2024-09-19 MED ORDER — AMOXICILLIN-POT CLAVULANATE 875-125 MG PO TABS: 1.0000 | ORAL_TABLET | Freq: Two times a day (BID) | ORAL | 0 refills | Status: AC

## 2024-09-19 MED ORDER — IPRATROPIUM BROMIDE 0.03 % NA SOLN
2.0000 | Freq: Two times a day (BID) | NASAL | 0 refills | Status: AC
Start: 2024-09-19 — End: ?

## 2024-09-19 NOTE — Patient Instructions (Addendum)
 Wayne Reed, thank you for joining Wayne CHRISTELLA Barefoot, NP for today's virtual visit.  While this provider is not your primary care provider (PCP), if your PCP is located in our provider database this encounter information will be shared with them immediately following your visit.   A [facility] MyChart account gives you access to today's visit and all your visits, tests, and labs performed at [facility]  click here if you don't have a [facility] MyChart account or go to [email or web address]  Consent: (Patient) Wayne Reed provided verbal consent for this virtual visit at the beginning of the encounter.  Current Medications:  Current Outpatient Medications:    amoxicillin -clavulanate (AUGMENTIN ) 875-125 MG tablet, Take 1 tablet by mouth 2 (two) times daily for 7 days., Disp: 14 tablet, Rfl: 0   ipratropium (ATROVENT) 0.03 % nasal spray, Place 2 sprays into both nostrils every 12 (twelve) hours., Disp: 30 mL, Rfl: 0   cyclobenzaprine  (FLEXERIL ) 5 MG tablet, Take 1 tablet (5 mg total) by mouth at bedtime., Disp: 30 tablet, Rfl: 1   pseudoephedrine  (SUDAFED) 60 MG tablet, Take 1 tablet (60 mg total) by mouth every 8 (eight) hours as needed for congestion., Disp: 15 tablet, Rfl: 0   Vitamin D , Ergocalciferol , (DRISDOL ) 1.25 MG (50000 UNIT) CAPS capsule, Take 1 capsule (50,000 Units total) by mouth every 7 (seven) days., Disp: 20 capsule, Rfl: 1   Wheat Dextrin (BENEFIBER DRINK MIX) PACK, Take 4 g by mouth at bedtime., Disp: , Rfl:    Medications ordered in this encounter:  Meds ordered this encounter  Medications   ipratropium (ATROVENT) 0.03 % nasal spray    Sig: Place 2 sprays into both nostrils every 12 (twelve) hours.    Dispense:  30 mL    Refill:  0    Supervising Provider:   LAMPTEY, PHILIP O [[id number]]   amoxicillin -clavulanate (AUGMENTIN ) 875-125 MG tablet    Sig: Take 1 tablet by mouth 2 (two) times daily for 7 days.    Dispense:  14 tablet    Refill:   0    Supervising Provider:   BLAISE ALEENE KIDD [[id number]]     *If you need refills on other medications prior to your next appointment, please contact your pharmacy*  Follow-Up: Call back or seek an in-person evaluation if the symptoms worsen or if the condition fails to improve as anticipated.  [facility] [phone]  Other Instructions  URI recommendations: - Increased rest - Increasing Fluids - Acetaminophen  / ibuprofen  as needed for fever/pain.  - Salt water gargling, chloraseptic spray and throat lozenges - Mucinex  if mucus is present and increasing.  - Saline nasal spray if congestion or if nasal passages feel dry. - Humidifying the air.      If you have been instructed to have an in-person evaluation today at a local Urgent Care facility, please use the link below. It will take you to a list of all of our available [facility], including address, phone number and hours of operation. Please do not delay care.  [facility]  If you or a family member do not have a primary care provider, use the link below to schedule a visit and establish care. When you choose a [facility] primary care physician or advanced practice provider, you gain a long-term partner in health. Find a Primary Care Provider  Learn more about [facility]'s in-office and virtual care options: [facility] - Get Care Now

## 2024-09-19 NOTE — Progress Notes (Signed)
 Virtual Visit Consent   Wayne Reed, you are scheduled for a virtual visit with a [facility] provider today. Just as with appointments in the office, your consent must be obtained to participate. Your consent will be active for this visit and any virtual visit you may have with one of our providers in the next 365 days. If you have a MyChart account, a copy of this consent can be sent to you electronically.  As this is a virtual visit, video technology does not allow for your provider to perform a traditional examination. This may limit your provider's ability to fully assess your condition. If your provider identifies any concerns that need to be evaluated in person or the need to arrange testing (such as labs, EKG, etc.), we will make arrangements to do so. Although advances in technology are sophisticated, we cannot ensure that it will always work on either your end or our end. If the connection with a video visit is poor, the visit may have to be switched to a telephone visit. With either a video or telephone visit, we are not always able to ensure that we have a secure connection.  By engaging in this virtual visit, you consent to the provision of healthcare and authorize for your insurance to be billed (if applicable) for the services provided during this visit. Depending on your insurance coverage, you may receive a charge related to this service.  I need to obtain your verbal consent now. Are you willing to proceed with your visit today? Rodrigus L Venne has provided verbal consent on 09/19/2024 for a virtual visit (video or telephone). Chiquita CHRISTELLA Barefoot, NP  Date: 09/19/2024 3:01 PM   Virtual Visit via Video Note   I, Chiquita CHRISTELLA Barefoot, connected with  LAWERANCE MATSUO  ([id number], 16-Apr-1996) on 09/19/24 at  3:00 PM EDT by a video-enabled telemedicine application and verified that I am speaking with the correct person using two identifiers.  Location: Patient: Virtual Visit Location  Patient: Home Provider: Virtual Visit Location Provider: Home Office   I discussed the limitations of evaluation and management by telemedicine and the availability of in person appointments. The patient expressed understanding and agreed to proceed.    History of Present Illness: Wayne Reed is a 28 y.o. who identifies as a male who was assigned male at birth, and is being seen today for on going aches and congestion.  Onset was 9/25 went to UC on 9/27 treated for viral URI- continuing to feel poorly.  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 Use in each nostril as directed, Disp: 30 mL, Rfl: 0   cyclobenzaprine  (FLEXERIL ) 5 MG tablet, Take 1 tablet (5 mg total)  by mouth at bedtime., Disp: 30 tablet, Rfl: 1   pseudoephedrine  (SUDAFED) 60 MG tablet, Take 1 tablet (60 mg total) by mouth every 8 (eight) hours as needed for congestion., Disp: 15 tablet, Rfl: 0   Vitamin D , Ergocalciferol , (DRISDOL ) 1.25 MG (50000 UNIT) CAPS capsule, Take 1 capsule (50,000 Units total) by mouth every 7 (seven) days., Disp: 20 capsule, Rfl: 1   Wheat Dextrin (BENEFIBER DRINK MIX) PACK, Take  4 g by mouth at bedtime., Disp: , Rfl:   Observations/Objective: Patient is well-developed, well-nourished in no acute distress.  Resting comfortably  at home.  Head is normocephalic, atraumatic.  No labored breathing.  Speech is clear and coherent with logical content.  Patient is alert and oriented at baseline.    Assessment and Plan:  1. Acute bacterial sinusitis (Primary)  - ipratropium (ATROVENT) 0.03 % nasal spray; Place 2 sprays into both nostrils every 12 (twelve) hours.  Dispense: 30 mL; Refill: 0 - amoxicillin -clavulanate (AUGMENTIN ) 875-125 MG tablet; Take 1 tablet by mouth 2 (two) times daily for 7 days.  Dispense: 14 tablet; Refill: 0   URI recommendations: - Increased rest - Increasing Fluids - Acetaminophen  / ibuprofen  as needed for fever/pain.  - Salt water gargling, chloraseptic spray and throat lozenges - Mucinex  if mucus is present and increasing.  - Saline nasal spray if congestion or if nasal passages feel dry. - Humidifying the air.     Reviewed side effects, risks and benefits of medication.    Patient acknowledged agreement and understanding of the plan.   Past Medical, Surgical, Social History, Allergies, and Medications have been Reviewed.    Follow Up Instructions: I discussed the assessment and treatment plan with the patient. The patient was provided an opportunity to ask questions and all were answered. The patient agreed with the plan and demonstrated an understanding of the instructions.  A copy of instructions were sent to the patient via MyChart unless otherwise noted below.    The patient was advised to call back or seek an in-person evaluation if the symptoms worsen or if the condition fails to improve as anticipated.    Chiquita CHRISTELLA Barefoot, NP

## 2024-09-20 ENCOUNTER — Encounter: Payer: Self-pay | Admitting: Family Medicine

## 2024-09-22 ENCOUNTER — Ambulatory Visit (INDEPENDENT_AMBULATORY_CARE_PROVIDER_SITE_OTHER): Admitting: Gastroenterology

## 2024-09-23 ENCOUNTER — Ambulatory Visit: Payer: Self-pay | Admitting: Family Medicine

## 2024-09-28 ENCOUNTER — Encounter: Payer: Self-pay | Admitting: Family Medicine

## 2024-10-12 ENCOUNTER — Telehealth: Admitting: Family Medicine

## 2024-10-12 ENCOUNTER — Encounter: Payer: Self-pay | Admitting: Family Medicine

## 2024-10-12 DIAGNOSIS — H60392 Other infective otitis externa, left ear: Secondary | ICD-10-CM

## 2024-10-12 DIAGNOSIS — H9202 Otalgia, left ear: Secondary | ICD-10-CM | POA: Diagnosis not present

## 2024-10-12 MED ORDER — FLUTICASONE PROPIONATE 50 MCG/ACT NA SUSP: 2.0000 | Freq: Every day | NASAL | 0 refills | Status: AC

## 2024-10-12 MED ORDER — CIPROFLOXACIN-DEXAMETHASONE 0.3-0.1 % OT SUSP: 4.0000 [drp] | Freq: Two times a day (BID) | OTIC | 0 refills | Status: DC

## 2024-10-12 NOTE — Patient Instructions (Addendum)
 Jenene LITTIE Alas, thank you for joining Chiquita CHRISTELLA Barefoot, NP for today's virtual visit.  While this provider is not your primary care provider (PCP), if your PCP is located in our provider database this encounter information will be shared with them immediately following your visit.   A [facility] MyChart account gives you access to today's visit and all your visits, tests, and labs performed at [facility]  click here if you don't have a [facility] MyChart account or go to [email or web address]  Consent: (Patient) Jenene LITTIE Alas provided verbal consent for this virtual visit at the beginning of the encounter.  Current Medications:  Current Outpatient Medications:    ciprofloxacin -dexamethasone  (CIPRODEX) OTIC suspension, Place 4 drops into the right ear 2 (two) times daily., Disp: 7.5 mL, Rfl: 0   fluticasone (FLONASE) 50 MCG/ACT nasal spray, Place 2 sprays into both nostrils daily., Disp: 16 g, Rfl: 0   cyclobenzaprine  (FLEXERIL ) 5 MG tablet, Take 1 tablet (5 mg total) by mouth at bedtime., Disp: 30 tablet, Rfl: 1   ipratropium (ATROVENT) 0.03 % nasal spray, Place 2 sprays into both nostrils every 12 (twelve) hours., Disp: 30 mL, Rfl: 0   pseudoephedrine  (SUDAFED) 60 MG tablet, Take 1 tablet (60 mg total) by mouth every 8 (eight) hours as needed for congestion., Disp: 15 tablet, Rfl: 0   Vitamin D , Ergocalciferol , (DRISDOL ) 1.25 MG (50000 UNIT) CAPS capsule, Take 1 capsule (50,000 Units total) by mouth every 7 (seven) days., Disp: 20 capsule, Rfl: 1   Wheat Dextrin (BENEFIBER DRINK MIX) PACK, Take 4 g by mouth at bedtime., Disp: , Rfl:    Medications ordered in this encounter:  Meds ordered this encounter  Medications   ciprofloxacin -dexamethasone  (CIPRODEX) OTIC suspension    Sig: Place 4 drops into the right ear 2 (two) times daily.    Dispense:  7.5 mL    Refill:  0    Supervising Provider:   BLAISE ALEENE KIDD [[id number]]   fluticasone (FLONASE) 50 MCG/ACT nasal  spray    Sig: Place 2 sprays into both nostrils daily.    Dispense:  16 g    Refill:  0    Supervising Provider:   BLAISE ALEENE KIDD [[id number]]     *If you need refills on other medications prior to your next appointment, please contact your pharmacy*  Follow-Up: Call back or seek an in-person evaluation if the symptoms worsen or if the condition fails to improve as anticipated.  [facility] [phone]  Other Instructions  Allergies Treatments   Take a new antihistamine once a day through the allergy season.  You may choose one of the following to use Xyzal, Zyrtec, or Claritin If needed you may take a second antihistamine in the evening. This would be a different antihistamine, older variety (diphenhydramine , chlorpheniramine, Chlor-Trimeton). These can make you sleepy.  Consider a nasal steroid like Flonase or Nasonex these open up the sinuses and help with headaches, and ear pressure and pain. Consider nasal salt water rinses- use boiled or distilled water only with these.  Get Afrin or generic (oxymetazoline ) as a nasal decongestant and to stop nosebleeds only use for 3 days max.  Treat your allergies so that they do not cause sinus infections.    If you have been instructed to have an in-person evaluation today at a local Urgent Care facility, please use the link below. It will take you to a list of all of our available [facility], including address,  phone number and hours of operation. Please do not delay care.  [facility]  If you or a family member do not have a primary care provider, use the link below to schedule a visit and establish care. When you choose a [facility] primary care physician or advanced practice provider, you gain a long-term partner in health. Find a Primary Care Provider  Learn more about [facility]'s in-office and virtual care options: [facility] - Get Care Now

## 2024-10-12 NOTE — Progress Notes (Signed)
 Virtual Visit Consent   Wayne Reed, you are scheduled for a virtual visit with a [facility] provider today. Just as with appointments in the office, your consent must be obtained to participate. Your consent will be active for this visit and any virtual visit you may have with one of our providers in the next 365 days. If you have a MyChart account, a copy of this consent can be sent to you electronically.  As this is a virtual visit, video technology does not allow for your provider to perform a traditional examination. This may limit your provider's ability to fully assess your condition. If your provider identifies any concerns that need to be evaluated in person or the need to arrange testing (such as labs, EKG, etc.), we will make arrangements to do so. Although advances in technology are sophisticated, we cannot ensure that it will always work on either your end or our end. If the connection with a video visit is poor, the visit may have to be switched to a telephone visit. With either a video or telephone visit, we are not always able to ensure that we have a secure connection.  By engaging in this virtual visit, you consent to the provision of healthcare and authorize for your insurance to be billed (if applicable) for the services provided during this visit. Depending on your insurance coverage, you may receive a charge related to this service.  I need to obtain your verbal consent now. Are you willing to proceed with your visit today? Wayne Reed has provided verbal consent on 10/12/2024 for a virtual visit (video or telephone). Wayne CHRISTELLA Barefoot, NP  Date: 10/12/2024 11:32 AM   Virtual Visit via Video Note   I, Wayne Reed, connected with  Wayne Reed  ([id number], 06-16-1996) on 10/12/24 at 11:30 AM EDT by a video-enabled telemedicine application and verified that I am speaking with the correct person using two identifiers.  Location: Patient: Virtual Visit  Location Patient: Home Provider: Virtual Visit Location Provider: Home Office   I discussed the limitations of evaluation and management by telemedicine and the availability of in person appointments. The patient expressed understanding and agreed to proceed.    History of Present Illness: Wayne Reed is a 28 y.o. who identifies as a male who was assigned male at birth, and is being seen today for ear pain.  Reports left ear pain that feels sharp at times. [place] of ear is painful. Developed sore throat 3 days after the ear pain. And nasal congestion as well- clear whitish.  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   BMI 37.0-37.9, adult 05/25/2024   Abnormal CT of the abdomen 05/25/2024   Viral illness 04/07/2024   Gastroenteritis 03/25/2024   Snoring 02/11/2024   Lumbar pain 12/28/2023   Obesity (BMI 35.0-39.9 without comorbidity) 01/30/2023   Hemorrhoids 01/30/2023   Screening for STD (sexually transmitted disease) 01/30/2023    Allergies: No Known Allergies Medications:  Current Outpatient Medications:    cyclobenzaprine  (FLEXERIL ) 5 MG tablet, Take 1 tablet (5 mg total) by mouth at bedtime., Disp: 30 tablet, Rfl: 1   ipratropium (ATROVENT) 0.03 % nasal spray, Place 2 sprays into both nostrils every 12 (twelve) hours., Disp: 30 mL, Rfl: 0   pseudoephedrine  (SUDAFED) 60 MG tablet, Take 1 tablet (60 mg total) by mouth every 8 (eight) hours  as needed for congestion., Disp: 15 tablet, Rfl: 0   Vitamin D , Ergocalciferol , (DRISDOL ) 1.25 MG (50000 UNIT) CAPS capsule, Take 1 capsule (50,000 Units total) by mouth every 7 (seven) days., Disp: 20 capsule, Rfl: 1   Wheat Dextrin (BENEFIBER DRINK MIX) PACK, Take 4 g by mouth at bedtime., Disp: , Rfl:   Observations/Objective: Patient is well-developed, well-nourished in no acute distress.  Resting comfortably   at home.  Head is normocephalic, atraumatic.  No labored breathing.  Speech is clear and coherent with logical content.  Patient is alert and oriented at baseline.    Assessment and Plan:  1. Acute ear pain, left (Primary)  - ciprofloxacin -dexamethasone  (CIPRODEX) OTIC suspension; Place 4 drops into the right ear 2 (two) times daily.  Dispense: 7.5 mL; Refill: 0 - fluticasone (FLONASE) 50 MCG/ACT nasal spray; Place 2 sprays into both nostrils daily.  Dispense: 16 g; Refill: 0  2. Acute infection of external ear, left  - ciprofloxacin -dexamethasone  (CIPRODEX) OTIC suspension; Place 4 drops into the right ear 2 (two) times daily.  Dispense: 7.5 mL; Refill: 0  -allergy/ ear infection vs eustachian tube dysfunction -given recent sinus infection and up coming flight- will treat for both -follow up in person if not improving  -allergy info discussed and on AVS as well    Reviewed side effects, risks and benefits of medication.    Patient acknowledged agreement and understanding of the plan.   Past Medical, Surgical, Social History, Allergies, and Medications have been Reviewed.    Follow Up Instructions: I discussed the assessment and treatment plan with the patient. The patient was provided an opportunity to ask questions and all were answered. The patient agreed with the plan and demonstrated an understanding of the instructions.  A copy of instructions were sent to the patient via MyChart unless otherwise noted below.    The patient was advised to call back or seek an in-person evaluation if the symptoms worsen or if the condition fails to improve as anticipated.    Wayne CHRISTELLA Barefoot, NP

## 2024-12-08 ENCOUNTER — Telehealth: Admitting: Physician Assistant

## 2024-12-08 DIAGNOSIS — B9789 Other viral agents as the cause of diseases classified elsewhere: Secondary | ICD-10-CM | POA: Diagnosis not present

## 2024-12-08 DIAGNOSIS — J019 Acute sinusitis, unspecified: Secondary | ICD-10-CM

## 2024-12-08 MED ORDER — PROMETHAZINE-DM 6.25-15 MG/5ML PO SYRP: 5.0000 mL | ORAL_SOLUTION | Freq: Four times a day (QID) | ORAL | 0 refills | Status: AC | PRN

## 2024-12-08 MED ORDER — PREDNISONE 10 MG (21) PO TBPK: ORAL_TABLET | ORAL | 0 refills | Status: AC

## 2024-12-08 NOTE — Progress Notes (Signed)
 Virtual Visit Consent   Wayne Reed, you are scheduled for a virtual visit with a  provider today. Just as with appointments in the office, your consent must be obtained to participate. Your consent will be active for this visit and any virtual visit you may have with one of our providers in the next 365 days. If you have a MyChart account, a copy of this consent can be sent to you electronically.  As this is a virtual visit, video technology does not allow for your provider to perform a traditional examination. This may limit your provider's ability to fully assess your condition. If your provider identifies any concerns that need to be evaluated in person or the need to arrange testing (such as labs, EKG, etc.), we will make arrangements to do so. Although advances in technology are sophisticated, we cannot ensure that it will always work on either your end or our end. If the connection with a video visit is poor, the visit may have to be switched to a telephone visit. With either a video or telephone visit, we are not always able to ensure that we have a secure connection.  By engaging in this virtual visit, you consent to the provision of healthcare and authorize for your insurance to be billed (if applicable) for the services provided during this visit. Depending on your insurance coverage, you may receive a charge related to this service.  I need to obtain your verbal consent now. Are you willing to proceed with your visit today? Winner L Rote has provided verbal consent on 12/08/2024 for a virtual visit (video or telephone). Wayne Reed, [place]  Date: 12/08/2024 3:25 PM   Virtual Visit via Video Note   I, Wayne Reed, connected with  JORDEN MINCHEY  ([id number], [birth year]) on 12/08/2024 at  3:15 PM EST by a video-enabled telemedicine application and verified that I am speaking with the correct person using two identifiers.  Location: Patient:  Virtual Visit Location Patient: Home Provider: Virtual Visit Location Provider: Home Office   I discussed the limitations of evaluation and management by telemedicine and the availability of in person appointments. The patient expressed understanding and agreed to proceed.    History of Present Illness: Wayne Reed is a 28 y.o. who identifies as a male who was assigned male at birth, and is being seen today for nasal congestion, sinus congestion and cough starting Monday. Denies fever, chills. Some aches starting today. Notes cough has been dry but today getting up some stuff from his post-nasal drip. Denies true chest congestion. Denies SOB. Is taking  OTC Mucinex  and Ibuprofen . No known recent travel or sick contact.  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  Resting comfortably  at home.  Head is normocephalic, atraumatic.  No labored breathing.  Speech is clear and coherent with logical content.  Patient is alert and oriented at baseline.   Assessment and Plan: 1. Acute viral sinusitis (Primary) - promethazine -dextromethorphan (PROMETHAZINE -DM) 6.25-15 MG/5ML syrup;  Take 5 mLs by mouth 4 (four) times daily as needed for cough.  Dispense: 118 mL; Refill: 0 - predniSONE  (STERAPRED UNI-PAK 21 TAB) 10 MG (21) TBPK tablet; Take following package directions  Dispense: 21 tablet; Refill: 0  Supportive measures and OTC medications  reviewed. Restart Flonase . Prednisone  pack and promethazine -dm per orders. In-person follow-up precautions reviewed.  Follow Up Instructions: I discussed the assessment and treatment plan with the patient. The patient was provided an opportunity to ask questions and all were answered. The patient agreed with the plan and demonstrated an understanding of the instructions.  A copy of instructions were sent to the patient via MyChart unless otherwise noted below.   The patient was advised to call back or seek an in-person evaluation if the symptoms worsen or if the condition fails to improve as anticipated.    Wayne Velma Lunger, PA-C    [1] No Known Allergies [2]  Current Outpatient Medications:    predniSONE  (STERAPRED UNI-PAK 21 TAB) 10 MG (21) TBPK tablet, Take following package directions, Disp: 21 tablet, Rfl: 0   promethazine -dextromethorphan (PROMETHAZINE -DM) 6.25-15 MG/5ML syrup, Take 5 mLs by mouth 4 (four) times daily as needed for cough., Disp: 118 mL, Rfl: 0   cyclobenzaprine  (FLEXERIL ) 5 MG tablet, Take 1 tablet (5 mg total) by mouth at bedtime., Disp: 30 tablet, Rfl: 1   fluticasone  (FLONASE ) 50 MCG/ACT nasal spray, Place 2 sprays into both nostrils daily., Disp: 16 g, Rfl: 0   pseudoephedrine  (SUDAFED) 60 MG tablet, Take 1 tablet (60 mg total) by mouth every 8 (eight) hours as needed for congestion., Disp: 15 tablet, Rfl: 0   Vitamin D , Ergocalciferol , (DRISDOL ) 1.25 MG (50000 UNIT) CAPS capsule, Take 1 capsule (50,000 Units total) by mouth every 7 (seven) days., Disp: 20 capsule, Rfl: 1   Wheat Dextrin (BENEFIBER DRINK MIX) PACK, Take 4 g by mouth at bedtime., Disp: , Rfl:

## 2024-12-08 NOTE — Patient Instructions (Signed)
 Wayne Reed, thank you for joining Wayne Velma Lunger, PA-C for today's virtual visit.  While this provider is not your primary care provider (PCP), if your PCP is located in our provider database this encounter information will be shared with them immediately following your visit.   A [facility] MyChart account gives you access to today's visit and all your visits, tests, and labs performed at [facility]  click here if you don't have a [facility] MyChart account or go to [email or web address]  Consent: (Patient) Wayne Reed provided verbal consent for this virtual visit at the beginning of the encounter.  Current Medications:  Current Outpatient Medications:    predniSONE  (STERAPRED UNI-PAK 21 TAB) 10 MG (21) TBPK tablet, Take following package directions, Disp: 21 tablet, Rfl: 0   promethazine -dextromethorphan (PROMETHAZINE -DM) 6.25-15 MG/5ML syrup, Take 5 mLs by mouth 4 (four) times daily as needed for cough., Disp: 118 mL, Rfl: 0   cyclobenzaprine  (FLEXERIL ) 5 MG tablet, Take 1 tablet (5 mg total) by mouth at bedtime., Disp: 30 tablet, Rfl: 1   fluticasone  (FLONASE ) 50 MCG/ACT nasal spray, Place 2 sprays into both nostrils daily., Disp: 16 g, Rfl: 0   pseudoephedrine  (SUDAFED) 60 MG tablet, Take 1 tablet (60 mg total) by mouth every 8 (eight) hours as needed for congestion., Disp: 15 tablet, Rfl: 0   Vitamin D , Ergocalciferol , (DRISDOL ) 1.25 MG (50000 UNIT) CAPS capsule, Take 1 capsule (50,000 Units total) by mouth every 7 (seven) days., Disp: 20 capsule, Rfl: 1   Wheat Dextrin (BENEFIBER DRINK MIX) PACK, Take 4 g by mouth at bedtime., Disp: , Rfl:    Medications ordered in this encounter:  Meds ordered this encounter  Medications   promethazine -dextromethorphan (PROMETHAZINE -DM) 6.25-15 MG/5ML syrup    Sig: Take 5 mLs by mouth 4 (four) times daily as needed for cough.    Dispense:  118 mL    Refill:  0    Supervising Provider:   BLAISE ALEENE Reed  [[id number]]   predniSONE  (STERAPRED UNI-PAK 21 TAB) 10 MG (21) TBPK tablet    Sig: Take following package directions    Dispense:  21 tablet    Refill:  0    Supervising Provider:   BLAISE ALEENE Reed [[id number]]     *If you need refills on other medications prior to your next appointment, please contact your pharmacy*  Follow-Up: Call back or seek an in-person evaluation if the symptoms worsen or if the condition fails to improve as anticipated.  [facility] [phone]  Other Instructions Please hydrate and rest. Start a saline nasal rinse. Restart use of your Flonase  once daily. Ok to continue Mucinex  and use Tylenol  OTC. Take prescribed medications as directed. If you note any non-resolving, new, or worsening symptoms despite treatment, please seek an in-person evaluation ASAP.    If you have been instructed to have an in-person evaluation today at a local Urgent Care facility, please use the link below. It will take you to a list of all of our available [facility], including address, phone number and hours of operation. Please do not delay care.  [facility]  If you or a family member do not have a primary care provider, use the link below to schedule a visit and establish care. When you choose a [facility] primary care physician or advanced practice provider, you gain a long-term partner in health. Find a Primary Care Provider  Learn more about 's in-office and  virtual care options: [facility] - Get Care Now

## 2024-12-11 ENCOUNTER — Telehealth

## 2024-12-11 ENCOUNTER — Telehealth: Admitting: Physician Assistant

## 2024-12-11 DIAGNOSIS — J019 Acute sinusitis, unspecified: Secondary | ICD-10-CM | POA: Diagnosis not present

## 2024-12-11 DIAGNOSIS — B9689 Other specified bacterial agents as the cause of diseases classified elsewhere: Secondary | ICD-10-CM

## 2024-12-11 MED ORDER — AMOXICILLIN-POT CLAVULANATE 875-125 MG PO TABS: 1.0000 | ORAL_TABLET | Freq: Two times a day (BID) | ORAL | 0 refills | Status: AC

## 2024-12-11 NOTE — Progress Notes (Signed)

## 2024-12-11 NOTE — Progress Notes (Signed)
 Patient seen by this provider 18/18 and diagnosed with viral sinusitis, starting steroid and cough medication. Still with persistent and progressive symptoms so antibiotic was added -- see below.

## 2024-12-17 ENCOUNTER — Emergency Department (HOSPITAL_COMMUNITY)
Admission: EM | Admit: 2024-12-17 | Discharge: 2024-12-18 | Disposition: A | Attending: Emergency Medicine | Admitting: Emergency Medicine

## 2024-12-17 ENCOUNTER — Encounter (HOSPITAL_COMMUNITY): Payer: Self-pay | Admitting: Emergency Medicine

## 2024-12-17 ENCOUNTER — Emergency Department (HOSPITAL_COMMUNITY)

## 2024-12-17 ENCOUNTER — Other Ambulatory Visit: Payer: Self-pay

## 2024-12-17 DIAGNOSIS — J069 Acute upper respiratory infection, unspecified: Secondary | ICD-10-CM | POA: Insufficient documentation

## 2024-12-17 DIAGNOSIS — Z79899 Other long term (current) drug therapy: Secondary | ICD-10-CM | POA: Diagnosis not present

## 2024-12-17 DIAGNOSIS — R059 Cough, unspecified: Secondary | ICD-10-CM | POA: Diagnosis present

## 2024-12-17 LAB — RESP PANEL BY RT-PCR (RSV, FLU A&B, COVID)  RVPGX2
Influenza A by PCR: NEGATIVE
Influenza B by PCR: NEGATIVE
Resp Syncytial Virus by PCR: NEGATIVE
SARS Coronavirus 2 by RT PCR: NEGATIVE

## 2024-12-17 MED ORDER — ACETAMINOPHEN 500 MG PO TABS
1000.0000 mg | ORAL_TABLET | Freq: Once | ORAL | Status: AC
  Administered 2024-12-17: 1000 mg via ORAL
  Filled 2024-12-17: qty 2

## 2024-12-17 NOTE — ED Provider Notes (Signed)
 " [facility] EMERGENC[facility] DEPARTMENT AT [facility] Provider Note  CSN: [id number] Arrival date & time: 12/17/24 2104  Chief Complaint(s) Flu Like Symptoms  HPI Wayne Reed is a 28 y.o. male without significant past medical history presenting to the emergency department for myalgias, cough.  Reports associated weakness, mild headache.  Subjective fevers at home.  Also having some nausea, diarrhea, chills.  Symptoms mild.  Has been taking over-the-counter medicine without much improvement.   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 Authorizing Provider  amoxicillin -clavulanate (AUGMENTIN ) 875-125 MG tablet Take 1 tablet by mouth 2 (two) times daily. 12/11/24   Gladis Elsie BROCKS, PA-C  cyclobenzaprine  (FLEXERIL ) 5 MG tablet Take 1 tablet (5 mg total) by mouth at bedtime. 12/28/23   Bacchus, Gloria Z, FNP  fluticasone  (FLONASE ) 50 MCG/ACT nasal spray Place 2 sprays into both nostrils daily. 10/12/24   Moishe Chiquita HERO, NP  predniSONE  (STERAPRED UNI-PAK 21 TAB) 10 MG (21) TBPK tablet Take following package directions 12/08/24   Gladis Elsie BROCKS, PA-C  promethazine -dextromethorphan (PROMETHAZINE -DM) 6.25-15 MG/5ML syrup Take 5 mLs by mouth 4 (four) times daily as needed for cough. 12/08/24   Gladis Elsie BROCKS, PA-C  pseudoephedrine  (SUDAFED) 60 MG tablet Take 1  tablet (60 mg total) by mouth every 8 (eight) hours as needed for congestion. 06/17/24   Stuart Vernell Norris, PA-C  Vitamin D , Ergocalciferol , (DRISDOL ) 1.25 MG (50000 UNIT) CAPS capsule Take 1 capsule (50,000 Units total) by mouth every 7 (seven) days. 01/02/24   Bacchus, Gloria Z, FNP  Wheat Dextrin (BENEFIBER DRINK MIX) PACK Take 4 g by mouth at bedtime. 06/27/24   Cinderella Deatrice FALCON, MD                                                                                                                                    Past Surgical History Past Surgical History:  Procedure Laterality Date   COLONOSCOPY N/A 06/27/2024   Procedure: COLONOSCOPY;  Surgeon: Cinderella Deatrice FALCON, MD;  Location: [facility];  Service: Endoscopy;  Laterality: N/A;  2:00 pm, asa 1/2   Family History Family History  Problem Relation Age of Onset   Hypertension Mother     Social History Social History[1] Allergies Patient has no known allergies.  Review of Systems Review of Systems  All other systems reviewed and are negative.   Physical Exam Vital Signs  I have reviewed the triage vital signs BP 124/87 (BP Location: Right Arm)   Pulse 89   Temp 98.5 F (36.9 C) (Oral)   Resp 18   Ht 6' 6 (1.981 m)   Wt (!) 145.8 kg   SpO2 98%   BMI 37.14 kg/m  Physical Exam Vitals and nursing note reviewed.  Constitutional:      General: [name] not in acute distress.    Appearance: Normal appearance.  HENT:     Mouth/Throat:     Mouth: Mucous membranes are moist.  Eyes:     Conjunctiva/sclera: Conjunctivae normal.  Cardiovascular:     Rate and Rhythm: Normal rate and regular rhythm.  Pulmonary:     Effort: Pulmonary effort is normal. No respiratory distress.     Breath sounds: Normal breath sounds.  Abdominal:     General: Abdomen is flat.     Palpations: Abdomen is soft.     Tenderness: There is no abdominal tenderness.  Musculoskeletal:     Right lower leg: No edema.     Left lower leg: No edema.   Skin:    General: Skin is warm and dry.     Capillary Refill: Capillary refill takes less than 2 seconds.  Neurological:     Mental Status: [name] alert and oriented to person, place, and time. Mental status is at baseline.  Psychiatric:        Mood and Affect: Mood normal.        Behavior: Behavior normal.     ED Results and Treatments Labs (all labs ordered are listed, but only abnormal results are displayed) Labs Reviewed  RESP PANEL BY RT-PCR (RSV, FLU A&B, COVID)  [id number]                                                                                                                          Radiology DG Chest Portable 1 View Result Date: 12/17/2024 EXAM: 1 VIEW(S) XRAY OF THE CHEST 12/17/2024 10:51:57 PM COMPARISON: Chest x-ray dated 04/08/2018. CLINICAL HISTORY: cough FINDINGS: LUNGS AND PLEURA: Low lung volumes. No focal pulmonary opacity. No pleural effusion. No pneumothorax. HEART AND MEDIASTINUM: No acute abnormality of the cardiac and mediastinal silhouettes. BONES AND SOFT TISSUES: No acute osseous abnormality. IMPRESSION: 1. No acute cardiopulmonary process. Electronically signed by: Greig Pique MD 12/17/2024 11:50 PM EST RP Workstation: [id number]    Pertinent labs & imaging results that were available during my care of the patient were reviewed by me and considered in my medical decision making (see MDM for details).  Medications Ordered in ED Medications  acetaminophen  (TYLENOL ) tablet 1,000 mg (1,000 mg Oral Given 12/17/24 2237)  Procedures Procedures  (including critical care time)  Medical Decision Making / ED Course   MDM:  28 year old presenting to the emergency department with URI symptoms.  Patient is overall well-appearing, physical examination is reassuring.  Initially with mild elevation in heart rate which is improved  on recheck.  His flu COVID testing is negative.  Obtain chest x-ray which shows no evidence of focal infiltrate or pneumonia.  Given symptoms seem most consistent with viral infection and patient is otherwise well-appearing and healthy, feel patient stable for discharge. Will discharge patient to home. All questions answered. Patient comfortable with plan of discharge. Return precautions discussed with patient and specified on the after visit summary.        Lab Tests: -I ordered, reviewed, and interpreted labs.   The pertinent results include:   Labs Reviewed  RESP PANEL BY RT-PCR (RSV, FLU A&B, COVID)  [id number]     Imaging Studies ordered: I ordered imaging studies including CXR On my interpretation imaging demonstrates no acute process I independently visualized and interpreted imaging. I agree with the radiologist interpretation   Medicines ordered and prescription drug management: Meds ordered this encounter  Medications   acetaminophen  (TYLENOL ) tablet 1,000 mg    -I have reviewed the patients home medicines and have made adjustments as needed   Reevaluation: After the interventions noted above, I reevaluated the patient and found that their symptoms have improved  Co morbidities that complicate the patient evaluation  Past Medical History:  Diagnosis Date   Hemorrhoid    Obesity    Screening for STD (sexually transmitted disease) 01/30/2023      Dispostion: Disposition decision including need for hospitalization was considered, and patient discharged from emergency department.    Final Clinical Impression(s) / ED Diagnoses Final diagnoses:  Viral URI     This chart was dictated using voice recognition software.  Despite best efforts to proofread,  errors can occur which can change the documentation meaning.     [1]  Social History Tobacco Use   Smoking status: Never   Smokeless tobacco: Never  Vaping Use   Vaping status: Never Used  Substance Use  Topics   Alcohol use: Yes    Comment: occassional   Drug use: No     Francesca Elsie CROME, MD 12/18/24 1834  "

## 2024-12-17 NOTE — ED Triage Notes (Addendum)
 Pov c/o body aches, cough, weakness, headaches, unknown fevers, n/d, chills that started 12/26. Denies vomiting

## 2024-12-17 NOTE — Discharge Instructions (Addendum)
 Please take Tylenol (acetaminophen) and Motrin (ibuprofen) for your symptoms at home.  You can take 1000 mg of Tylenol every 6 hours and 600 mg of Motrin every 6 hours as needed for your symptoms.  You can take these medicines together as needed, either at the same time, or alternating every 3 hours.
